# Patient Record
Sex: Female | Born: 1970 | Race: White | Hispanic: No | Marital: Married | Smoking: Never smoker
Health system: Southern US, Community
[De-identification: ages and names within clinical notes are randomized; demographics above are authoritative.]

## PROBLEM LIST (undated history)

## (undated) DIAGNOSIS — E559 Vitamin D deficiency, unspecified: Secondary | ICD-10-CM

## (undated) DIAGNOSIS — R519 Headache, unspecified: Secondary | ICD-10-CM

## (undated) DIAGNOSIS — R1011 Right upper quadrant pain: Secondary | ICD-10-CM

## (undated) DIAGNOSIS — Z9889 Other specified postprocedural states: Secondary | ICD-10-CM

## (undated) DIAGNOSIS — R112 Nausea with vomiting, unspecified: Secondary | ICD-10-CM

## (undated) DIAGNOSIS — R51 Headache: Secondary | ICD-10-CM

## (undated) HISTORY — DX: Vitamin D deficiency, unspecified: E55.9

## (undated) HISTORY — DX: Headache, unspecified: R51.9

## (undated) HISTORY — PX: WISDOM TOOTH EXTRACTION: SHX21

## (undated) HISTORY — DX: Headache: R51

## (undated) HISTORY — DX: Other specified postprocedural states: R11.2

## (undated) HISTORY — DX: Other specified postprocedural states: Z98.890

---

## 1998-11-01 ENCOUNTER — Inpatient Hospital Stay (HOSPITAL_COMMUNITY): Admission: AD | Admit: 1998-11-01 | Discharge: 1998-11-01 | Payer: Self-pay | Admitting: Obstetrics and Gynecology

## 1998-11-01 ENCOUNTER — Inpatient Hospital Stay (HOSPITAL_COMMUNITY): Admission: AD | Admit: 1998-11-01 | Discharge: 1998-11-03 | Payer: Self-pay | Admitting: Obstetrics and Gynecology

## 1998-11-07 ENCOUNTER — Encounter (HOSPITAL_COMMUNITY): Admission: RE | Admit: 1998-11-07 | Discharge: 1999-02-05 | Payer: Self-pay | Admitting: Obstetrics and Gynecology

## 2001-02-12 ENCOUNTER — Other Ambulatory Visit: Admission: RE | Admit: 2001-02-12 | Discharge: 2001-02-12 | Payer: Self-pay | Admitting: Gynecology

## 2001-12-21 ENCOUNTER — Inpatient Hospital Stay (HOSPITAL_COMMUNITY): Admission: AD | Admit: 2001-12-21 | Discharge: 2001-12-23 | Payer: Self-pay | Admitting: Gynecology

## 2002-01-28 ENCOUNTER — Other Ambulatory Visit: Admission: RE | Admit: 2002-01-28 | Discharge: 2002-01-28 | Payer: Self-pay | Admitting: Gynecology

## 2003-03-28 ENCOUNTER — Other Ambulatory Visit: Admission: RE | Admit: 2003-03-28 | Discharge: 2003-03-28 | Payer: Self-pay | Admitting: Gynecology

## 2003-09-21 ENCOUNTER — Other Ambulatory Visit: Admission: RE | Admit: 2003-09-21 | Discharge: 2003-09-21 | Payer: Self-pay | Admitting: Gynecology

## 2004-04-02 ENCOUNTER — Other Ambulatory Visit: Admission: RE | Admit: 2004-04-02 | Discharge: 2004-04-02 | Payer: Self-pay | Admitting: Gynecology

## 2005-04-09 ENCOUNTER — Other Ambulatory Visit: Admission: RE | Admit: 2005-04-09 | Discharge: 2005-04-09 | Payer: Self-pay | Admitting: Gynecology

## 2005-10-17 ENCOUNTER — Other Ambulatory Visit: Admission: RE | Admit: 2005-10-17 | Discharge: 2005-10-17 | Payer: Self-pay | Admitting: Gynecology

## 2006-06-19 ENCOUNTER — Other Ambulatory Visit: Admission: RE | Admit: 2006-06-19 | Discharge: 2006-06-19 | Payer: Self-pay | Admitting: Gynecology

## 2007-06-28 ENCOUNTER — Other Ambulatory Visit: Admission: RE | Admit: 2007-06-28 | Discharge: 2007-06-28 | Payer: Self-pay | Admitting: Gynecology

## 2008-03-12 ENCOUNTER — Inpatient Hospital Stay (HOSPITAL_COMMUNITY): Admission: AD | Admit: 2008-03-12 | Discharge: 2008-03-13 | Payer: Self-pay | Admitting: Obstetrics and Gynecology

## 2010-09-17 NOTE — Discharge Summary (Signed)
NAMEQUYNH, BASSO               ACCOUNT NO.:  [id number]   MEDICAL RECORD NO.:  [id number]          PATIENT TYPE:  INP   LOCATION:  9116                          FACILITY:  [facility]   PHYSICIAN:  Gerrit Friends. Aldona Bar, M.D.   DATE OF BIRTH:  05-Aug-1970   DATE OF ADMISSION:  03/12/2008  DATE OF DISCHARGE:  03/13/2008                               DISCHARGE SUMMARY   DISCHARGE DIAGNOSES:  1. Term pregnancy, delivered 6 pounds 12 ounces female infant, Apgars      9 and 9.  2. Blood type A positive.   PROCEDURES:  1. Normal spontaneous delivery.  2. Second-degree tear and repair.   SUMMARY:  This is a 40 year old gravida 3 now para 3 presented with term  and active labor and rapidly progressed and had a normal spontaneous  delivery of a viable 6-pound 12-ounce female infant with good Apgars  over second-degree tear, which was repaired without difficulty.  Postpartum course was benign.  Discharge hemoglobin 10.9 with a white  count of 1900 and platelet count of 141,000.  The patient delivered on  the afternoon of March 12, 2008, and was desirous of discharge on  March 13, 2008, and he accordingly was discharged to home as the baby  was discharged after 24 hours of observation as well.   At the time of discharge, mother was given a discharge brochure to  follow instructions.  She will finish up her vitamins.  She will use  Advil to every 4 or every 6 hours as needed for cramping or pain.  She  will return to the office for followup in approximately 4 weeks' time or  as needed.  She understood all instructions well at the time of  discharge.   CONDITION ON DISCHARGE:  Improved.      Gerrit Friends. Aldona Bar, M.D.  Electronically Signed     RMW/MEDQ  D:  03/13/2008  T:  03/13/2008  Job:  [id number]

## 2010-09-20 NOTE — Discharge Summary (Signed)
   NAME:  Abigail Choi, Abigail Choi                         ACCOUNT NO.:  [id number]   MEDICAL RECORD NO.:  [id number]                   PATIENT TYPE:  INP   LOCATION:  9115                                 [facility]ACILITY:  [facility]   PHYSICIAN:  Devin M. Ciliberti, M.D.            DATE OF BIRTH:  06/11/1970   DATE OF ADMISSION:  12/21/2001  DATE OF DISCHARGE:  12/23/2001                                 DISCHARGE SUMMARY   DISCHARGE DIAGNOSES:  1. Intrauterine pregnancy 39 weeks delivered.  2. Status post spontaneous vaginal delivery.   HISTORY:  A 41 year old female gravida 2, para 1 with an EDC of December 27, 2001.  Prenatal course was uncomplicated.   HOSPITAL COURSE:  On December 21, 2001 patient was admitted in labor.  Subsequently underwent a spontaneous vaginal delivery.  Delivered a female  Apgars of 9 and 9, weight 7 pounds 4 ounces.  There was a midline episiotomy  with second degree midline extension which was repaired.  There were  bilateral superficial labia minora lacerations.  No sutures were needed.  Postpartum patient remained afebrile, voiding, stable condition.  She was  discharged to home on December 23, 2001 in satisfactory condition.   ACCESSORY CLINICAL FINDINGS:  Laboratories:  The patient is A+, rubella  immune.  Hemoglobin on December 22, 2001 was 9.4.   DISPOSITION:  The patient discharged to home __________ six weeks.  Any  problem prior to that time to see in the office.     Susa Loffler, P.A.                    Devin M. Ciliberti, M.D.    TSG/MEDQ  D:  01/28/2002  T:  01/28/2002  Job:  [id number]

## 2010-09-26 ENCOUNTER — Other Ambulatory Visit (HOSPITAL_COMMUNITY): Payer: Self-pay | Admitting: Gastroenterology

## 2010-09-26 DIAGNOSIS — R1011 Right upper quadrant pain: Secondary | ICD-10-CM

## 2010-10-21 ENCOUNTER — Encounter (HOSPITAL_COMMUNITY): Payer: Self-pay

## 2010-10-21 ENCOUNTER — Encounter (HOSPITAL_COMMUNITY)
Admission: RE | Admit: 2010-10-21 | Discharge: 2010-10-21 | Disposition: A | Discharge status: Home / Self Care | Payer: Managed Care, Other (non HMO) | Source: Ambulatory Visit | Attending: Gastroenterology | Admitting: Gastroenterology

## 2010-10-21 DIAGNOSIS — R1011 Right upper quadrant pain: Secondary | ICD-10-CM

## 2010-10-21 HISTORY — DX: Right upper quadrant pain: R10.11

## 2010-10-21 MED ORDER — TECHNETIUM TC 99M MEBROFENIN IV KIT
5.4000 | PACK | Freq: Once | INTRAVENOUS | Status: AC | PRN
  Administered 2010-10-21: 5.4 via INTRAVENOUS

## 2010-10-21 MED ORDER — SINCALIDE 5 MCG IJ SOLR: 0.0200 ug/kg | Freq: Once | INTRAMUSCULAR | Status: DC

## 2011-02-04 LAB — CBC
HCT: 31.5 — ABNORMAL LOW
HCT: 37.2
Hemoglobin: 10.9 — ABNORMAL LOW
Hemoglobin: 12.9
MCHC: 34.5
MCHC: 34.6
MCV: 100.6 — ABNORMAL HIGH
MCV: 99.7
Platelets: 141 — ABNORMAL LOW
Platelets: 152
RBC: 3.13 — ABNORMAL LOW
RBC: 3.73 — ABNORMAL LOW
RDW: 13.3
RDW: 13.6
WBC: 10.8 — ABNORMAL HIGH
WBC: 9.8

## 2011-02-04 LAB — RPR: RPR Ser Ql: NONREACTIVE

## 2012-12-07 ENCOUNTER — Ambulatory Visit
Admission: RE | Admit: 2012-12-07 | Discharge: 2012-12-07 | Disposition: A | Discharge status: Home / Self Care | Payer: No Typology Code available for payment source | Source: Ambulatory Visit | Attending: Family Medicine | Admitting: Family Medicine

## 2012-12-07 ENCOUNTER — Other Ambulatory Visit: Payer: Self-pay | Admitting: Family Medicine

## 2012-12-07 DIAGNOSIS — R519 Headache, unspecified: Secondary | ICD-10-CM

## 2013-10-13 ENCOUNTER — Ambulatory Visit: Payer: Self-pay | Admitting: Family Medicine

## 2013-10-13 ENCOUNTER — Ambulatory Visit (INDEPENDENT_AMBULATORY_CARE_PROVIDER_SITE_OTHER): Payer: BC Managed Care – PPO | Admitting: Family Medicine

## 2013-10-13 ENCOUNTER — Encounter (INDEPENDENT_AMBULATORY_CARE_PROVIDER_SITE_OTHER): Payer: Self-pay

## 2013-10-13 ENCOUNTER — Encounter: Payer: Self-pay | Admitting: Family Medicine

## 2013-10-13 VITALS — BP 114/62 | HR 87 | Temp 98.2°F | Ht 67.0 in | Wt 132.5 lb

## 2013-10-13 DIAGNOSIS — R51 Headache: Secondary | ICD-10-CM

## 2013-10-13 DIAGNOSIS — G8929 Other chronic pain: Secondary | ICD-10-CM | POA: Insufficient documentation

## 2013-10-13 MED ORDER — MELOXICAM 15 MG PO TABS: ORAL_TABLET | ORAL | Status: DC

## 2013-10-13 NOTE — Assessment & Plan Note (Signed)
>  35 minutes spent in face to face time with patient, >50% spent in counselling or coordination of care. ?tension vs variant of TMJ/TGN Will try prn NSAID Headache journal Follow up in 1 month.

## 2013-10-13 NOTE — Progress Notes (Signed)
Subjective:   Patient ID: Abigail Choi, female    DOB: 11-18-1970, 43 y.o.   MRN: [id number]  Abigail Choi is a pleasant 43 y.o. year old female who presents to clinic today with Establish Care and Headache  on 10/13/2013  HPI: She has two concerns today:  1.  ?abnormal mammogram-  Sees Dr. Zenia Resides.  Mammogram and pap in 03/2013. Mammogram was neg but stated she had dense breasts.  Called OBGYN and told her there was no issues but she wanted to talk to me about this today. No FH of breast CA.  2.  Headaches- started last summer around 11/05/2012.  No prior h/o HA. Intermittent, started out daily and sharp.  Mostly right sided- no associated n/v or sensitivity to light.  Affects head, ear, face, sometimes burning. Previous PCP ordered head CT and placed her on muscle relaxants prn.  She felt it was tension HA. Pt was concerned because grandmother and uncle died of GBM.  Neg head CT 2012-12-10  Clinical Data: 43 year old female with right side headache. Left  extremity tingling. Some dizziness. Family history of brain  tumor. No known head injury.  CT HEAD WITHOUT CONTRAST  Technique: Contiguous axial images were obtained from the base of  the skull through the vertex without contrast.  Comparison: None.  Findings: Visualized paranasal sinuses and mastoids are clear. No  acute osseous abnormality identified. Visualized orbits and scalp  soft tissues are within normal limits.  Cerebral volume is normal. No midline shift, ventriculomegaly,  mass effect, evidence of mass lesion, intracranial hemorrhage or  evidence of cortically based acute infarction. Gray-white matter  differentiation is within normal limits throughout the brain. No  suspicious intracranial vascular hyperdensity.  IMPRESSION:  Normal noncontrast CT appearance of the brain.   Still gets them intermittent, now moves around- left and right.  No current outpatient prescriptions on file prior to visit.   No  current facility-administered medications on file prior to visit.    No Known Allergies  Past Medical History  Diagnosis Date  . RUQ abdominal pain   . Frequent headaches     History reviewed. No pertinent past surgical history.  Family History  Problem Relation Age of Onset  . Hypertension Father   . Heart disease Maternal Grandfather   . Cancer Paternal Grandmother   . Hypertension Paternal Grandfather   . Diabetes Paternal Grandfather     History   Social History  . Marital Status: Married    Spouse Name: N/A    Number of Children: N/A  . Years of Education: N/A   Occupational History  . Not on file.   Social History Main Topics  . Smoking status: Never Smoker   . Smokeless tobacco: Never Used  . Alcohol Use: No  . Drug Use: No  . Sexual Activity: Yes    Birth Control/ Protection: IUD   Other Topics Concern  . Not on file   Social History Narrative  . No narrative on file   The PMH, PSH, Social History, Family History, Medications, and allergies have been reviewed in [facility], and have been updated if relevant.      Review of Systems  See HPI    No blurred vision No nausea or vomiting No UE or LE weakness Objective:    BP 114/62  Pulse 87  Temp(Src) 98.2 F (36.8 C) (Oral)  Ht 5\' 7"  (1.702 m)  Wt 132 lb 8 oz (60.102 kg)  BMI 20.75 kg/m2  SpO2 98%   Physical Exam Gen: alert, pleasant, NAD Neuro: CN II-XII intact Normal gait Psych:  Good eye contact, not anxious or depressed appearing       Assessment & Plan:   Chronic headache disorder No Follow-up on file.

## 2013-10-13 NOTE — Patient Instructions (Addendum)
It was nice to meet you. Please keep a headache journal for me and follow up in 1 month.  Please consider a 3D mammogram in the future.  Doctor's orders- weekly massage.

## 2013-10-13 NOTE — Progress Notes (Signed)
Pre visit review using our clinic review tool, if applicable. No additional management support is needed unless otherwise documented below in the visit note. 

## 2013-11-02 ENCOUNTER — Encounter: Payer: Self-pay | Admitting: Family Medicine

## 2013-11-14 ENCOUNTER — Encounter: Payer: Self-pay | Admitting: Family Medicine

## 2013-11-14 ENCOUNTER — Ambulatory Visit (INDEPENDENT_AMBULATORY_CARE_PROVIDER_SITE_OTHER): Payer: BC Managed Care – PPO | Admitting: Family Medicine

## 2013-11-14 VITALS — BP 108/68 | HR 72 | Temp 97.8°F | Wt 133.2 lb

## 2013-11-14 DIAGNOSIS — G8929 Other chronic pain: Secondary | ICD-10-CM

## 2013-11-14 DIAGNOSIS — R519 Headache, unspecified: Secondary | ICD-10-CM

## 2013-11-14 DIAGNOSIS — R51 Headache: Secondary | ICD-10-CM

## 2013-11-14 NOTE — Patient Instructions (Signed)
Great to see you. Keep your journal.   Call me in a few weeks with an update- bring a copy of your journal to me if you can.

## 2013-11-14 NOTE — Assessment & Plan Note (Signed)
>  15 minutes spent in face to face time with patient, >50% spent in counselling or coordination of care. Very consistent with tension headaches. Advised to continue massage, journal. Drop off journal in 1 month and we can discuss over the phone. Call or return to clinic prn if these symptoms worsen or fail to improve as anticipated. The patient indicates understanding of these issues and agrees with the plan.

## 2013-11-14 NOTE — Progress Notes (Signed)
Pre visit review using our clinic review tool, if applicable. No additional management support is needed unless otherwise documented below in the visit note. 

## 2013-11-14 NOTE — Progress Notes (Signed)
Subjective:   Patient ID: Abigail Choi, female    DOB: [birth year], 43 y.o.   MRN: [id number]  Abigail Choi is a pleasant 43 y.o. year old female who presents to clinic today with Follow-up  on 11/14/2013  HPI: Here for 1 month follow up.  Established care with me last month:   Headaches- started last summer around 11/05/2012.  No prior h/o HA. Intermittent, started out daily and sharp.  Mostly right sided- no associated n/v or sensitivity to light.  Affects head, ear, face, sometimes burning. Previous PCP ordered head CT and placed her on muscle relaxants prn.  She felt it was tension HA. Pt was concerned because grandmother and uncle died of GBM.  Neg head CT 12-15-12  Clinical Data: 43 year old female with right side headache. Left  extremity tingling. Some dizziness. Family history of brain  tumor. No known head injury.  CT HEAD WITHOUT CONTRAST  Technique: Contiguous axial images were obtained from the base of  the skull through the vertex without contrast.  Comparison: None.  Findings: Visualized paranasal sinuses and mastoids are clear. No  acute osseous abnormality identified. Visualized orbits and scalp  soft tissues are within normal limits.  Cerebral volume is normal. No midline shift, ventriculomegaly,  mass effect, evidence of mass lesion, intracranial hemorrhage or  evidence of cortically based acute infarction. Gray-white matter  differentiation is within normal limits throughout the brain. No  suspicious intracranial vascular hyperdensity.  IMPRESSION:  Normal noncontrast CT appearance of the brain.   Still gets them intermittent, now moves around- left and right.  Advised her to keep HA journal. ?tension vs variant of TMJ. Given rx for meloxicam.  Brings her headache journal in--feels MUCH better.  Massage every other week has decreased her HA and intensity of her headaches tremendously.  Only took one dose of meloxicam.  Current Outpatient Prescriptions  on File Prior to Visit  Medication Sig Dispense Refill  . cyclobenzaprine (FLEXERIL) 10 MG tablet Take 10 mg by mouth at bedtime as needed for muscle spasms.      . meloxicam (MOBIC) 15 MG tablet 1 tab by mouth daily with food  30 tablet  0   No current facility-administered medications on file prior to visit.    No Known Allergies  Past Medical History  Diagnosis Date  . RUQ abdominal pain   . Frequent headaches     No past surgical history on file.  Family History  Problem Relation Age of Onset  . Hypertension Father   . Heart disease Maternal Grandfather   . Cancer Paternal Grandmother   . Hypertension Paternal Grandfather   . Diabetes Paternal Grandfather     History   Social History  . Marital Status: Married    Spouse Name: N/A    Number of Children: N/A  . Years of Education: N/A   Occupational History  . Not on file.   Social History Main Topics  . Smoking status: Never Smoker   . Smokeless tobacco: Never Used  . Alcohol Use: No  . Drug Use: No  . Sexual Activity: Yes    Birth Control/ Protection: IUD   Other Topics Concern  . Not on file   Social History Narrative  . No narrative on file   The PMH, PSH, Social History, Family History, Medications, and allergies have been reviewed in [facility], and have been updated if relevant.      Review of Systems  See HPI    No blurred  vision No nausea or vomiting No UE or LE weakness Objective:    BP 108/68  Pulse 72  Temp(Src) 97.8 F (36.6 C) (Oral)  Wt 133 lb 4 oz (60.442 kg)   Physical Exam Gen: alert, pleasant, NAD Normal gait Psych:  Good eye contact, not anxious or depressed appearing       Assessment & Plan:   Chronic nonintractable headache, unspecified headache type No Follow-up on file.

## 2014-03-27 ENCOUNTER — Ambulatory Visit (INDEPENDENT_AMBULATORY_CARE_PROVIDER_SITE_OTHER): Payer: BC Managed Care – PPO | Admitting: Family Medicine

## 2014-03-27 ENCOUNTER — Encounter: Payer: Self-pay | Admitting: Family Medicine

## 2014-03-27 ENCOUNTER — Other Ambulatory Visit (HOSPITAL_COMMUNITY)
Admission: RE | Admit: 2014-03-27 | Discharge: 2014-03-27 | Disposition: A | Discharge status: Home / Self Care | Payer: BC Managed Care – PPO | Source: Ambulatory Visit | Attending: Family Medicine | Admitting: Family Medicine

## 2014-03-27 VITALS — BP 102/62 | HR 72 | Temp 98.4°F | Ht 67.0 in | Wt 136.0 lb

## 2014-03-27 DIAGNOSIS — Z01419 Encounter for gynecological examination (general) (routine) without abnormal findings: Secondary | ICD-10-CM

## 2014-03-27 DIAGNOSIS — R51 Headache: Secondary | ICD-10-CM

## 2014-03-27 DIAGNOSIS — G8929 Other chronic pain: Secondary | ICD-10-CM

## 2014-03-27 DIAGNOSIS — Z1239 Encounter for other screening for malignant neoplasm of breast: Secondary | ICD-10-CM

## 2014-03-27 DIAGNOSIS — R519 Headache, unspecified: Secondary | ICD-10-CM

## 2014-03-27 DIAGNOSIS — Z1151 Encounter for screening for human papillomavirus (HPV): Secondary | ICD-10-CM | POA: Diagnosis present

## 2014-03-27 DIAGNOSIS — R202 Paresthesia of skin: Secondary | ICD-10-CM | POA: Insufficient documentation

## 2014-03-27 DIAGNOSIS — Z23 Encounter for immunization: Secondary | ICD-10-CM

## 2014-03-27 LAB — COMPREHENSIVE METABOLIC PANEL
ALT: 13 U/L (ref 0–35)
AST: 18 U/L (ref 0–37)
Albumin: 4.4 g/dL (ref 3.5–5.2)
Alkaline Phosphatase: 47 U/L (ref 39–117)
BUN: 14 mg/dL (ref 6–23)
CO2: 26 mEq/L (ref 19–32)
Calcium: 9.7 mg/dL (ref 8.4–10.5)
Chloride: 104 mEq/L (ref 96–112)
Creatinine, Ser: 0.8 mg/dL (ref 0.4–1.2)
GFR: 82.19 mL/min (ref >60.00)
Glucose, Bld: 90 mg/dL (ref 70–99)
Potassium: 4.3 mEq/L (ref 3.5–5.1)
Sodium: 138 mEq/L (ref 135–145)
Total Bilirubin: 0.7 mg/dL (ref 0.2–1.2)
Total Protein: 7.5 g/dL (ref 6.0–8.3)

## 2014-03-27 LAB — CBC WITH DIFFERENTIAL/PLATELET
Basophils Absolute: 0 10*3/uL (ref 0.0–0.1)
Basophils Relative: 0.6 % (ref 0.0–3.0)
Eosinophils Absolute: 0 10*3/uL (ref 0.0–0.7)
Eosinophils Relative: 1.2 % (ref 0.0–5.0)
HCT: 36.4 % (ref 36.0–46.0)
Hemoglobin: 12.2 g/dL (ref 12.0–15.0)
Lymphocytes Relative: 26.1 % (ref 12.0–46.0)
Lymphs Abs: 1 10*3/uL (ref 0.7–4.0)
MCHC: 33.6 g/dL (ref 30.0–36.0)
MCV: 95.9 fl (ref 78.0–100.0)
Monocytes Absolute: 0.3 10*3/uL (ref 0.1–1.0)
Monocytes Relative: 7.1 % (ref 3.0–12.0)
Neutro Abs: 2.6 10*3/uL (ref 1.4–7.7)
Neutrophils Relative %: 65 % (ref 43.0–77.0)
Platelets: 163 10*3/uL (ref 150.0–400.0)
RBC: 3.79 Mil/uL — ABNORMAL LOW (ref 3.87–5.11)
RDW: 12.3 % (ref 11.5–15.5)
WBC: 4 10*3/uL (ref 4.0–10.5)

## 2014-03-27 LAB — VITAMIN B12: Vitamin B-12: 531 pg/mL (ref 211–911)

## 2014-03-27 LAB — LIPID PANEL
Cholesterol: 174 mg/dL (ref 0–200)
HDL: 57.4 mg/dL (ref >39.00)
LDL Cholesterol: 107 mg/dL — ABNORMAL HIGH (ref 0–99)
NonHDL: 116.6
Total CHOL/HDL Ratio: 3
Triglycerides: 46 mg/dL (ref 0.0–149.0)
VLDL: 9.2 mg/dL (ref 0.0–40.0)

## 2014-03-27 LAB — TSH: TSH: 1.83 u[IU]/mL (ref 0.35–4.50)

## 2014-03-27 NOTE — Progress Notes (Signed)
Pre visit review using our clinic review tool, if applicable. No additional management support is needed unless otherwise documented below in the visit note. 

## 2014-03-27 NOTE — Progress Notes (Signed)
Subjective:   Patient ID: Abigail Choi, female    DOB: 02/24/1971, 43 y.o.   MRN: [id number]  Abigail Choi is a pleasant 43 y.o. year old female who presents to clinic today with Annual Exam  on 03/27/2014  HPI:  Has had IUD for 4 years.  Still has "random" spotting but periods much lighter than they were prior to getting IUD. Due for mammogram.  Chronic headaches- started in 11/2012.  Remain right sided and intermittent.  Head CT in 12/2012 neg.  She is concerned because now she has tingling in her feet and arms (moves sides) consistently.  Neg head CT 12/07/12  Clinical Data: 43 year old female with right side headache. Left  extremity tingling. Some dizziness. Family history of brain  tumor. No known head injury.  CT HEAD WITHOUT CONTRAST  Technique: Contiguous axial images were obtained from the base of  the skull through the vertex without contrast.  Comparison: None.  Findings: Visualized paranasal sinuses and mastoids are clear. No  acute osseous abnormality identified. Visualized orbits and scalp  soft tissues are within normal limits.  Cerebral volume is normal. No midline shift, ventriculomegaly,  mass effect, evidence of mass lesion, intracranial hemorrhage or  evidence of cortically based acute infarction. Gray-white matter  differentiation is within normal limits throughout the brain. No  suspicious intracranial vascular hyperdensity.  IMPRESSION:  Normal noncontrast CT appearance of the brain.   Still gets them intermittent, now moves around- left and right.  Initially massage was helping with headaches but she does not feel it is as effective.  Current Outpatient Prescriptions on File Prior to Visit  Medication Sig Dispense Refill  . cyclobenzaprine (FLEXERIL) 10 MG tablet Take 10 mg by mouth at bedtime as needed for muscle spasms.    . meloxicam (MOBIC) 15 MG tablet 1 tab by mouth daily with food 30 tablet 0   No current facility-administered medications  on file prior to visit.    No Known Allergies  Past Medical History  Diagnosis Date  . RUQ abdominal pain   . Frequent headaches     No past surgical history on file.  Family History  Problem Relation Age of Onset  . Hypertension Father   . Heart disease Maternal Grandfather   . Cancer Paternal Grandmother   . Hypertension Paternal Grandfather   . Diabetes Paternal Grandfather     History   Social History  . Marital Status: Married    Spouse Name: N/A    Number of Children: N/A  . Years of Education: N/A   Occupational History  . Not on file.   Social History Main Topics  . Smoking status: Never Smoker   . Smokeless tobacco: Never Used  . Alcohol Use: No  . Drug Use: No  . Sexual Activity: Yes    Birth Control/ Protection: IUD   Other Topics Concern  . Not on file   Social History Narrative   The PMH, PSH, Social History, Family History, Medications, and allergies [facility], and have been updated if relevant.      Review of Systems  Constitutional: Negative.   Eyes: Negative.   Respiratory: Negative.   Cardiovascular: Negative.   Gastrointestinal: Negative.   Endocrine: Negative.   Genitourinary: Negative.   Musculoskeletal: Negative.   Allergic/Immunologic: Negative.   Neurological: Positive for numbness and headaches. Negative for dizziness, tremors, seizures, syncope, facial asymmetry, speech difficulty, weakness and light-headedness.  Hematological: Negative.     See HPI  Objective:    BP 102/62 mmHg  Pulse 72  Temp(Src) 98.4 F (36.9 C) (Oral)  Ht 5\' 7"  (1.702 m)  Wt 136 lb (61.689 kg)  BMI 21.30 kg/m2  SpO2 97%  LMP 03/25/2014   Physical Exam  General:  Well-developed,well-nourished,in no acute distress; alert,appropriate and cooperative throughout examination Head:  normocephalic and atraumatic.   Eyes:  vision grossly intact, pupils equal, pupils round, and pupils reactive to light.   Ears:  R ear normal  and L ear normal.   Nose:  no external deformity.   Mouth:  good dentition.   Neck:  No deformities, masses, or tenderness noted. Breasts:  No mass, nodules, thickening, tenderness, bulging, retraction, inflamation, nipple discharge or skin changes noted.   Lungs:  Normal respiratory effort, chest expands symmetrically. Lungs are clear to auscultation, no crackles or wheezes. Heart:  Normal rate and regular rhythm. S1 and S2 normal without gallop, murmur, click, rub or other extra sounds. Abdomen:  Bowel sounds positive,abdomen soft and non-tender without masses, organomegaly or hernias noted. Rectal:  no external abnormalities.   Genitalia:  Pelvic Exam:        External: normal female genitalia without lesions or masses        Vagina: normal without lesions or masses        Cervix: normal without lesions or masses        Adnexa: normal bimanual exam without masses or fullness        Uterus: normal by palpation        Pap smear: performed Msk:  No deformity or scoliosis noted of thoracic or lumbar spine.   Extremities:  No clubbing, cyanosis, edema, or deformity noted with normal full range of motion of all joints.   Neurologic:  alert & oriented X3 and gait normal.   Skin:  Intact without suspicious lesions or rashes Cervical Nodes:  No lymphadenopathy noted Axillary Nodes:  No palpable lymphadenopathy Psych:  Cognition and judgment appear intact. Alert and cooperative with normal attention span and concentration. No apparent delusions, illusions, hallucinations       Assessment & Plan:   Well woman exam with routine gynecological exam - Plan: CBC with Differential, Comprehensive metabolic panel, Lipid panel, TSH  Screening for breast cancer - Plan: MM Digital Screening  Chronic nonintractable headache, unspecified headache type - Plan: MR Brain W Wo Contrast  Paresthesia - Plan: Vitamin B12, MR Brain W Wo Contrast No Follow-up on file.

## 2014-03-27 NOTE — Assessment & Plan Note (Signed)
Persistent and now with paresthesias. Discussed with pt.  She is very anxious about this since she does have a family h/o GBM.  Will order MRI of brain to rule out other pathology like MS, etc. The patient indicates understanding of these issues and agrees with the plan.

## 2014-03-27 NOTE — Addendum Note (Signed)
Addended by: Desmond DikeKNIGHT, Aymar Whitfill H on: 03/27/2014 10:42 AM   Modules accepted: Orders

## 2014-03-27 NOTE — Assessment & Plan Note (Signed)
New- see above. 

## 2014-03-27 NOTE — Assessment & Plan Note (Addendum)
Reviewed preventive care protocols, scheduled due services, and updated immunizations Discussed nutrition, exercise, diet, and healthy lifestyle.  Pap smear  Mammogram ordered. Flu vaccine given today.  Orders Placed This Encounter  Procedures  . MM Digital Screening  . MR Brain W Wo Contrast  . CBC with Differential  . Comprehensive metabolic panel  . Lipid panel  . TSH  . Vitamin B12

## 2014-03-27 NOTE — Patient Instructions (Signed)
Great to see you. Have a Happy Holiday.  We will call you with your lab results.  Please schedule your mammogram.  We will call you with your MRI or you can stop by to see Shirlee LimerickMarion on your way out.

## 2014-03-28 ENCOUNTER — Encounter: Payer: Self-pay | Admitting: *Deleted

## 2014-03-28 LAB — CYTOLOGY - PAP

## 2014-03-29 ENCOUNTER — Encounter: Payer: Self-pay | Admitting: *Deleted

## 2014-04-04 ENCOUNTER — Ambulatory Visit
Admission: RE | Admit: 2014-04-04 | Discharge: 2014-04-04 | Disposition: A | Discharge status: Home / Self Care | Payer: BC Managed Care – PPO | Source: Ambulatory Visit | Attending: Family Medicine | Admitting: Family Medicine

## 2014-04-04 DIAGNOSIS — R519 Headache, unspecified: Secondary | ICD-10-CM

## 2014-04-04 DIAGNOSIS — G8929 Other chronic pain: Secondary | ICD-10-CM

## 2014-04-04 DIAGNOSIS — R202 Paresthesia of skin: Secondary | ICD-10-CM

## 2014-04-04 DIAGNOSIS — R51 Headache: Principal | ICD-10-CM

## 2014-04-04 MED ORDER — GADOBENATE DIMEGLUMINE 529 MG/ML IV SOLN
12.0000 mL | Freq: Once | INTRAVENOUS | Status: AC | PRN
  Administered 2014-04-04: 12 mL via INTRAVENOUS

## 2014-04-06 ENCOUNTER — Other Ambulatory Visit: Payer: BC Managed Care – PPO

## 2014-04-18 ENCOUNTER — Ambulatory Visit
Admission: RE | Admit: 2014-04-18 | Discharge: 2014-04-18 | Disposition: A | Discharge status: Home / Self Care | Payer: BC Managed Care – PPO | Source: Ambulatory Visit | Attending: Family Medicine | Admitting: Family Medicine

## 2014-04-18 DIAGNOSIS — Z1239 Encounter for other screening for malignant neoplasm of breast: Secondary | ICD-10-CM

## 2014-11-08 ENCOUNTER — Encounter: Payer: Self-pay | Admitting: Family Medicine

## 2014-11-09 ENCOUNTER — Telehealth: Payer: Self-pay | Admitting: Family Medicine

## 2014-11-09 NOTE — Telephone Encounter (Signed)
I cannot make that call without seeing her.  Please call to check on pt.

## 2014-11-09 NOTE — Telephone Encounter (Signed)
Spoke to pt who states that she believes that it may be GERD. She is not having dizziness, HA or ShOB. Thinks that she will be ok until appt. Advised pt to contact office back or go to ED if s/s change or increase

## 2014-11-09 NOTE — Telephone Encounter (Signed)
Pt made her own appointment through my chart  Is it ok to wait till 11/16/14  See below reason  Appointment For: Abigail GublerRHAM,Abigail Choi ([id number])    Visit Type: MYCHART OFFICE VISIT (1064)      11/16/2014  8:15 AM 15 mins. Dianne Dunalia M Aron, MD     [facility]      Patient Comments:   Office Visit   Chest pain

## 2014-11-16 ENCOUNTER — Encounter: Payer: Self-pay | Admitting: Family Medicine

## 2014-11-16 ENCOUNTER — Ambulatory Visit (INDEPENDENT_AMBULATORY_CARE_PROVIDER_SITE_OTHER): Payer: BLUE CROSS/BLUE SHIELD | Admitting: Family Medicine

## 2014-11-16 VITALS — BP 108/68 | HR 76 | Temp 98.1°F | Wt 132.2 lb

## 2014-11-16 DIAGNOSIS — R079 Chest pain, unspecified: Secondary | ICD-10-CM | POA: Diagnosis not present

## 2014-11-16 LAB — H. PYLORI ANTIBODY, IGG: H Pylori IgG: NEGATIVE

## 2014-11-16 NOTE — Assessment & Plan Note (Signed)
New- intermittent. Unlikely cardiac in nature.  EKG reassuring- NSR and symptoms reassuring as well. Advised taking prn pepcid or PPI like nexium and to keep me updated with symptoms. Check H Pylori today to rule this out as well. The patient indicates understanding of these issues and agrees with the plan.

## 2014-11-16 NOTE — Progress Notes (Signed)
Pre visit review using our clinic review tool, if applicable. No additional management support is needed unless otherwise documented below in the visit note. 

## 2014-11-16 NOTE — Patient Instructions (Signed)
Good to see you. Your EKG looks great which is very reassuring. Please take as needed pepcid, keep me updated. I will call you with your lab results from today.

## 2014-11-16 NOTE — Progress Notes (Signed)
Subjective:   Patient ID: Abigail Choi, female    DOB: 12/16/1970, 44 y.o.   MRN: [id number]  Abigail Gublerngela S Tison is a pleasant 44 y.o. year old female who presents to clinic today with Gastrophageal Reflux and Chest Pain  on 11/16/2014  HPI:  Chest pain- since mid May (at least 2 months), intermittent epigastric, substernal pain.  Does not typically occur with exertion.  Occurs "randomly" and at rest.  Has tried pepcid.  Not sure if it made it better.  Symptoms are fleeting.  Not associated with nausea or vomiting.  No diaphoresis.  She did notice her left arm was a little sore once which made her nervous.  Her grandmother had an MI in her 1150s.  No shortness of breath. Lab Results  Component Value Date   CHOL 174 03/27/2014   HDL 57.40 03/27/2014   LDLCALC 107* 03/27/2014   TRIG 46.0 03/27/2014   CHOLHDL 3 03/27/2014   Current Outpatient Prescriptions on File Prior to Visit  Medication Sig Dispense Refill  . cyclobenzaprine (FLEXERIL) 10 MG tablet Take 10 mg by mouth at bedtime as needed for muscle spasms.    . meloxicam (MOBIC) 15 MG tablet 1 tab by mouth daily with food 30 tablet 0   No current facility-administered medications on file prior to visit.    No Known Allergies  Past Medical History  Diagnosis Date  . RUQ abdominal pain   . Frequent headaches     No past surgical history on file.  Family History  Problem Relation Age of Onset  . Hypertension Father   . Heart disease Maternal Grandfather   . Cancer Paternal Grandmother   . Hypertension Paternal Grandfather   . Diabetes Paternal Grandfather     History   Social History  . Marital Status: Married    Spouse Name: N/A  . Number of Children: N/A  . Years of Education: N/A   Occupational History  . Not on file.   Social History Main Topics  . Smoking status: Never Smoker   . Smokeless tobacco: Never Used  . Alcohol Use: No  . Drug Use: No  . Sexual Activity: Yes    Birth Control/ Protection: IUD     Other Topics Concern  . Not on file   Social History Narrative   The PMH, PSH, Social History, Family History, Medications, and allergies have been reviewed in [facility], and have been updated if relevant.   Review of Systems  Constitutional: Negative.   HENT: Negative.   Eyes: Negative.   Respiratory: Negative.   Cardiovascular: Positive for chest pain. Negative for palpitations and leg swelling.  Endocrine: Negative.   Genitourinary: Negative.   Musculoskeletal: Positive for arthralgias.  Skin: Negative.   Allergic/Immunologic: Negative.   Neurological: Negative.   Hematological: Negative.   Psychiatric/Behavioral: Negative.   All other systems reviewed and are negative.      Objective:    BP 108/68 mmHg  Pulse 76  Temp(Src) 98.1 F (36.7 C) (Oral)  Wt 132 lb 4 oz (59.988 kg)  SpO2 97%   Physical Exam  Constitutional: She is oriented to person, place, and time. She appears well-developed and well-nourished. No distress.  HENT:  Head: Normocephalic and atraumatic.  Eyes: Conjunctivae are normal.  Cardiovascular: Normal rate, regular rhythm and normal heart sounds.   Pulmonary/Chest: Effort normal and breath sounds normal. No respiratory distress. She has no wheezes.  Abdominal: Soft.  Musculoskeletal: Normal range of motion. She exhibits no edema.  Neurological: She is alert and oriented to person, place, and time. No cranial nerve deficit.  Skin: Skin is warm and dry.  Psychiatric: She has a normal mood and affect. Her behavior is normal. Judgment and thought content normal.  Nursing note and vitals reviewed.         Assessment & Plan:   Chest pain, unspecified chest pain type - Plan: EKG 12-Lead No Follow-up on file.

## 2014-11-17 ENCOUNTER — Encounter: Payer: Self-pay | Admitting: Family Medicine

## 2015-03-21 ENCOUNTER — Encounter: Payer: Self-pay | Admitting: Family Medicine

## 2015-03-28 ENCOUNTER — Other Ambulatory Visit: Payer: Self-pay

## 2015-03-28 DIAGNOSIS — Z1231 Encounter for screening mammogram for malignant neoplasm of breast: Secondary | ICD-10-CM

## 2015-04-09 ENCOUNTER — Encounter: Payer: Self-pay | Admitting: Family Medicine

## 2015-04-10 ENCOUNTER — Encounter: Payer: Self-pay | Admitting: Emergency Medicine

## 2015-04-10 ENCOUNTER — Emergency Department: Payer: BLUE CROSS/BLUE SHIELD

## 2015-04-10 ENCOUNTER — Emergency Department
Admission: EM | Admit: 2015-04-10 | Discharge: 2015-04-10 | Disposition: A | Discharge status: Home / Self Care | Payer: BLUE CROSS/BLUE SHIELD | Attending: Emergency Medicine | Admitting: Emergency Medicine

## 2015-04-10 DIAGNOSIS — R450 Nervousness: Secondary | ICD-10-CM | POA: Insufficient documentation

## 2015-04-10 DIAGNOSIS — F419 Anxiety disorder, unspecified: Secondary | ICD-10-CM | POA: Insufficient documentation

## 2015-04-10 DIAGNOSIS — Z791 Long term (current) use of non-steroidal anti-inflammatories (NSAID): Secondary | ICD-10-CM | POA: Insufficient documentation

## 2015-04-10 DIAGNOSIS — R0789 Other chest pain: Secondary | ICD-10-CM | POA: Diagnosis present

## 2015-04-10 DIAGNOSIS — Z79899 Other long term (current) drug therapy: Secondary | ICD-10-CM | POA: Insufficient documentation

## 2015-04-10 DIAGNOSIS — R079 Chest pain, unspecified: Secondary | ICD-10-CM

## 2015-04-10 DIAGNOSIS — R202 Paresthesia of skin: Secondary | ICD-10-CM | POA: Insufficient documentation

## 2015-04-10 LAB — BASIC METABOLIC PANEL
Anion gap: 7 (ref 5–15)
BUN: 14 mg/dL (ref 6–20)
CO2: 26 mmol/L (ref 22–32)
Calcium: 9.6 mg/dL (ref 8.9–10.3)
Chloride: 106 mmol/L (ref 101–111)
Creatinine, Ser: 0.81 mg/dL (ref 0.44–1.00)
GFR calc Af Amer: >60 mL/min (ref >60)
GFR calc non Af Amer: >60 mL/min (ref >60)
Glucose, Bld: 119 mg/dL — ABNORMAL HIGH (ref 65–99)
Potassium: 3.8 mmol/L (ref 3.5–5.1)
Sodium: 139 mmol/L (ref 135–145)

## 2015-04-10 LAB — CBC
HCT: 37.2 % (ref 35.0–47.0)
Hemoglobin: 12.6 g/dL (ref 12.0–16.0)
MCH: 31.9 pg (ref 26.0–34.0)
MCHC: 33.8 g/dL (ref 32.0–36.0)
MCV: 94.3 fL (ref 80.0–100.0)
Platelets: 177 10*3/uL (ref 150–440)
RBC: 3.94 MIL/uL (ref 3.80–5.20)
RDW: 12.3 % (ref 11.5–14.5)
WBC: 6.4 10*3/uL (ref 3.6–11.0)

## 2015-04-10 LAB — TROPONIN I
Troponin I: <0.03 ng/mL (ref <0.031)
Troponin I: <0.03 ng/mL (ref <0.031)

## 2015-04-10 NOTE — Discharge Instructions (Signed)
You were evaluated for sensation of hot flash and chest discomfort, and although no certain cause was found, your exam and evaluation are reassuring. We discussed I suspect he may have had an episode of transient low blood pressure from mild dehydration especially given the caffeine intake this morning. Make sure you drinking plain fluids.  Return to the emergency department for any worsening condition including chest pain, trouble breathing, sweats, dizziness or passing out, weakness, numbness, or any other symptoms concerning to.  We discussed although you do not have any significant high risk factors for coronary artery disease, I am recommending that you follow-up with a cardiologist, and call Dr. Ria Clockalderwood's office in the morning for an appointment within the next 2 days.   Nonspecific Chest Pain It is often hard to find the cause of chest pain. There is always a chance that your pain could be related to something serious, such as a heart attack or a blood clot in your lungs. Chest pain can also be caused by conditions that are not life-threatening. If you have chest pain, it is very important to follow up with your doctor.  HOME CARE  If you were prescribed an antibiotic medicine, finish it all even if you start to feel better.  Avoid any activities that cause chest pain.  Do not use any tobacco products, including cigarettes, chewing tobacco, or electronic cigarettes. If you need help quitting, ask your doctor.  Do not drink alcohol.  Take medicines only as told by your doctor.  Keep all follow-up visits as told by your doctor. This is important. This includes any further testing if your chest pain does not go away.  Your doctor may tell you to keep your head raised (elevated) while you sleep.  Make lifestyle changes as told by your doctor. These may include:  Getting regular exercise. Ask your doctor to suggest some activities that are safe for you.  Eating a heart-healthy  diet. Your doctor or a diet specialist (dietitian) can help you to learn healthy eating options.  Maintaining a healthy weight.  Managing diabetes, if necessary.  Reducing stress. GET HELP IF:  Your chest pain does not go away, even after treatment.  You have a rash with blisters on your chest.  You have a fever. GET HELP RIGHT AWAY IF:  Your chest pain is worse.  You have an increasing cough, or you cough up blood.  You have severe belly (abdominal) pain.  You feel extremely weak.  You pass out (faint).  You have chills.  You have sudden, unexplained chest discomfort.  You have sudden, unexplained discomfort in your arms, back, neck, or jaw.  You have shortness of breath at any time.  You suddenly start to sweat, or your skin gets clammy.  You feel nauseous.  You vomit.  You suddenly feel light-headed or dizzy.  Your heart begins to beat quickly, or it feels like it is skipping beats. These symptoms may be an emergency. Do not wait to see if the symptoms will go away. Get medical help right away. Call your local emergency services (911 in the [place] Do not drive yourself to the hospital.   This information is not intended to replace advice given to you by your health care provider. Make sure you discuss any questions you have with your health care provider.   Document Released: 10/08/2007 Document Revised: 05/12/2014 Document Reviewed: 11/25/2013 Elsevier Interactive Patient Education [employer]2016 Elsevier Inc.

## 2015-04-10 NOTE — ED Provider Notes (Signed)
[facility] Emergency Department Provider Note   ____________________________________________  Time seen: I have reviewed the triage vital signs and the triage nursing note.  HISTORY  Chief Complaint Chest Pain   Historian Patient  HPI Abigail Choi is a 44 y.o. female who is here for evaluation of chest pressure and heart racing. Patient states that she has had several episodes over the past month or so where she has had some mild left-sided chest discomfort. She did see her PCP about a month ago and had an EKG and was told it was normal. She's never had exertional chest discomfort. 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 After she completed the class, and went back to her office she felt a sensation of flush/warmth from her head down to her body and then started to feel slightly anxious with heart racing and left-sided chest pressure with some radiation to the left arm. She states that she's had some tingling to her left arm at times in the past, which has not been associated with chest discomfort. She is questioning whether she might have had symptoms of carpal tunnel at some point in time.  She is mostly worried about whether or not these symptoms could be due to the heart.    Past Medical History  Diagnosis Date  . RUQ abdominal pain   . Frequent headaches     Patient Active Problem List   Diagnosis Date Noted  . Chest pain 11/16/2014  . Paresthesia 03/27/2014  . Chronic headache disorder 10/13/2013    History reviewed. No pertinent past surgical history.  Current Outpatient Rx  Name  Route  Sig  Dispense  Refill  . cyclobenzaprine (FLEXERIL) 10 MG tablet   Oral   Take 10 mg by mouth at bedtime as needed for muscle spasms.         . meloxicam (MOBIC) 15 MG tablet      1 tab by mouth daily with food   30 tablet   0     Allergies Review of patient's allergies indicates no  known allergies.  Family History  Problem Relation Age of Onset  . Hypertension Father   . Heart disease Maternal Grandfather   . Cancer Paternal Grandmother   . Hypertension Paternal Grandfather   . Diabetes Paternal Grandfather     Social History Social History  Substance Use Topics  . Smoking status: Never Smoker   . Smokeless tobacco: Never Used  . Alcohol Use: No     Review of Systems  Constitutional: Negative for fever. Eyes: Negative for visual changes. ENT: Negative for sore throat. Cardiovascular: Negative for irregular heartbeat Respiratory: Negative for shortness of breath, negative for coughing. Gastrointestinal: Negative for abdominal pain, vomiting and diarrhea. Genitourinary: Negative for dysuria. Musculoskeletal: Negative for back pain. Skin: Negative for rash. Neurological: Negative for headache. 10 point Review of Systems otherwise negative ____________________________________________   PHYSICAL EXAM:  VITAL SIGNS: ED Triage Vitals  Enc Vitals Group     BP 04/10/15 1236 136/75 mmHg     Pulse Rate 04/10/15 1236 125     Resp 04/10/15 1236 20     Temp 04/10/15 1236 97.4 F (36.3 C)     Temp Source 04/10/15 1236 Oral     SpO2 04/10/15 1236 100 %     Weight 04/10/15 1236 133 lb (60.328 kg)     Height 04/10/15 1236 5' 7.25" (1.708 m)  Head Cir --      Peak Flow --      Pain Score 04/10/15 1233 2     Pain Loc --      Pain Edu? --      Excl. in GC? --      Constitutional: Alert and oriented. Well appearing and in no distress. Slightly anxious. Eyes: Conjunctivae are normal. PERRL. Normal extraocular movements. ENT   Head: Normocephalic and atraumatic.   Nose: No congestion/rhinnorhea.   Mouth/Throat: Mucous membranes are moist.   Neck: No stridor. Cardiovascular/Chest: Normal rate, regular rhythm.  No murmurs, rubs, or gallops. Respiratory: Normal respiratory effort without tachypnea nor retractions. Breath sounds are clear  and equal bilaterally. No wheezes/rales/rhonchi. Gastrointestinal: Soft. No distention, no guarding, no rebound. Nontender   Genitourinary/rectal:Deferred Musculoskeletal: Nontender with normal range of motion in all extremities. No joint effusions.  No lower extremity tenderness.  No edema. Neurologic:  Normal speech and language. No gross or focal neurologic deficits are appreciated. Skin:  Skin is warm, dry and intact. No rash noted. Psychiatric: Mood and affect are normal. Speech and behavior are normal. Patient exhibits appropriate insight and judgment.  ____________________________________________   EKG I, Governor Rooks, MD, the attending physician have personally viewed and interpreted all ECGs.  114 bpm. Sinus tachycardia. Incomplete right bundle-branch block. Undetermined axis. Nonspecific ST/T wave.  Repeat EKG. 75 bpm. Normal sinus rhythm. Incomplete right bundle-branch block. Normal axis. Normal ST and T-wave ____________________________________________  LABS (pertinent positives/negatives)  Basic metabolic panel, CBC are without significant abnormality Troponin less than 0.03 Repeat troponin: Less than 0.03  ____________________________________________  RADIOLOGY All Xrays were viewed by me. Imaging interpreted by Radiologist.  Chest a 2 view:  IMPRESSION: 1. Biapical pleural parenchymal scarring, likely due to remote granulomatous disease. Otherwise, no significant abnormalities are observed. __________________________________________  PROCEDURES  Procedure(s) performed: None  Critical Care performed: None  ____________________________________________   ED COURSE / ASSESSMENT AND PLAN  CONSULTATIONS: None  Pertinent labs & imaging results that were available during my care of the patient were reviewed by me and considered in my medical decision making (see chart for details).   Patient is overall well-appearing with a normal exam. She is slightly  anxious, and I am most suspicious that the episode today was a combination of excess sympathomimetic due to caffeine intake plus adrenaline from stress of job this morning, and possible mild dehydration.  Her initial heart rate was elevated, and on exam now it is normal. Her EKG is reassuring. She does not have any risk factors for coronary artery disease, however given that she's had multiple episodes of chest discomfort, I am going to refer her to follow-up with a cardiologist and consider echocardiogram/stress testing as recommended by the cardiologist.    Patient / Family / Caregiver informed of clinical course, medical decision-making process, and agree with plan.   I discussed return precautions, follow-up instructions, and discharged instructions with patient and/or family.  ___________________________________________   FINAL CLINICAL IMPRESSION(S) / ED DIAGNOSES   Final diagnoses:  Nonspecific chest pain       Governor Rooks, MD 04/10/15 1650

## 2015-04-10 NOTE — ED Notes (Signed)
Pt c/o intermittent episodes of upper left chest pressure and tightness; today was a little different-had a warm sensation come across her body; pt says the episodes come on quickly and leave quickly; pain right low down left arm; pt says "i'm shaking like crazy";

## 2015-04-10 NOTE — ED Notes (Signed)
Pt states she had a feeling of chest pain radiating to her left arm along with a flushed feeling this AM, denies any pain at this time, pt states she had a similar feeling about a month ago and saw her PCP and had an EKG preformed and was cleared, pt in no acute distress at this time

## 2015-04-18 ENCOUNTER — Ambulatory Visit (INDEPENDENT_AMBULATORY_CARE_PROVIDER_SITE_OTHER): Payer: BLUE CROSS/BLUE SHIELD | Admitting: Family Medicine

## 2015-04-18 ENCOUNTER — Encounter: Payer: Self-pay | Admitting: Family Medicine

## 2015-04-18 ENCOUNTER — Other Ambulatory Visit (HOSPITAL_COMMUNITY)
Admission: RE | Admit: 2015-04-18 | Discharge: 2015-04-18 | Disposition: A | Discharge status: Home / Self Care | Payer: BLUE CROSS/BLUE SHIELD | Source: Ambulatory Visit | Attending: Family Medicine | Admitting: Family Medicine

## 2015-04-18 VITALS — BP 114/70 | HR 90 | Temp 98.2°F | Ht 66.75 in | Wt 131.0 lb

## 2015-04-18 DIAGNOSIS — Z0000 Encounter for general adult medical examination without abnormal findings: Secondary | ICD-10-CM | POA: Diagnosis not present

## 2015-04-18 DIAGNOSIS — R079 Chest pain, unspecified: Secondary | ICD-10-CM | POA: Insufficient documentation

## 2015-04-18 DIAGNOSIS — Z23 Encounter for immunization: Secondary | ICD-10-CM | POA: Diagnosis not present

## 2015-04-18 DIAGNOSIS — Z1151 Encounter for screening for human papillomavirus (HPV): Secondary | ICD-10-CM | POA: Insufficient documentation

## 2015-04-18 DIAGNOSIS — Z01411 Encounter for gynecological examination (general) (routine) with abnormal findings: Secondary | ICD-10-CM | POA: Insufficient documentation

## 2015-04-18 DIAGNOSIS — Z01419 Encounter for gynecological examination (general) (routine) without abnormal findings: Secondary | ICD-10-CM | POA: Insufficient documentation

## 2015-04-18 LAB — CBC WITH DIFFERENTIAL/PLATELET
Basophils Absolute: 0 10*3/uL (ref 0.0–0.1)
Basophils Relative: 0.3 % (ref 0.0–3.0)
Eosinophils Absolute: 0 10*3/uL (ref 0.0–0.7)
Eosinophils Relative: 0.1 % (ref 0.0–5.0)
HCT: 35.8 % — ABNORMAL LOW (ref 36.0–46.0)
Hemoglobin: 12.2 g/dL (ref 12.0–15.0)
Lymphocytes Relative: 16.6 % (ref 12.0–46.0)
Lymphs Abs: 0.9 10*3/uL (ref 0.7–4.0)
MCHC: 34.1 g/dL (ref 30.0–36.0)
MCV: 94.8 fl (ref 78.0–100.0)
Monocytes Absolute: 0.3 10*3/uL (ref 0.1–1.0)
Monocytes Relative: 5.3 % (ref 3.0–12.0)
Neutro Abs: 4 10*3/uL (ref 1.4–7.7)
Neutrophils Relative %: 77.7 % — ABNORMAL HIGH (ref 43.0–77.0)
Platelets: 186 10*3/uL (ref 150.0–400.0)
RBC: 3.78 Mil/uL — ABNORMAL LOW (ref 3.87–5.11)
RDW: 12.4 % (ref 11.5–15.5)
WBC: 5.2 10*3/uL (ref 4.0–10.5)

## 2015-04-18 LAB — VITAMIN D 25 HYDROXY (VIT D DEFICIENCY, FRACTURES): VITD: 21.91 ng/mL — ABNORMAL LOW (ref 30.00–100.00)

## 2015-04-18 LAB — LIPID PANEL
Cholesterol: 141 mg/dL (ref 0–200)
HDL: 57.1 mg/dL (ref >39.00)
LDL Cholesterol: 74 mg/dL (ref 0–99)
NonHDL: 83.6
Total CHOL/HDL Ratio: 2
Triglycerides: 47 mg/dL (ref 0.0–149.0)
VLDL: 9.4 mg/dL (ref 0.0–40.0)

## 2015-04-18 LAB — TSH: TSH: 1.25 u[IU]/mL (ref 0.35–4.50)

## 2015-04-18 LAB — COMPREHENSIVE METABOLIC PANEL
ALT: 10 U/L (ref 0–35)
AST: 15 U/L (ref 0–37)
Albumin: 4.5 g/dL (ref 3.5–5.2)
Alkaline Phosphatase: 43 U/L (ref 39–117)
BUN: 10 mg/dL (ref 6–23)
CO2: 24 mEq/L (ref 19–32)
Calcium: 9.6 mg/dL (ref 8.4–10.5)
Chloride: 104 mEq/L (ref 96–112)
Creatinine, Ser: 0.78 mg/dL (ref 0.40–1.20)
GFR: 85.43 mL/min (ref >60.00)
Glucose, Bld: 83 mg/dL (ref 70–99)
Potassium: 3.7 mEq/L (ref 3.5–5.1)
Sodium: 138 mEq/L (ref 135–145)
Total Bilirubin: 0.5 mg/dL (ref 0.2–1.2)
Total Protein: 7.5 g/dL (ref 6.0–8.3)

## 2015-04-18 LAB — VITAMIN B12: Vitamin B-12: 321 pg/mL (ref 211–911)

## 2015-04-18 MED ORDER — CYCLOBENZAPRINE HCL 10 MG PO TABS: 10.0000 mg | ORAL_TABLET | Freq: Every evening | ORAL | Status: DC | PRN

## 2015-04-18 NOTE — Assessment & Plan Note (Signed)
Reviewed preventive care protocols, scheduled due services, and updated immunizations Discussed nutrition, exercise, diet, and healthy lifestyle.  Discussed USPSTF recommendations of cervical cancer screening.  She is aware that interval of 3 years is recommended but pt would prefer to have pap smear done today.  Labs today.  Influenza vaccine today.  Orders Placed This Encounter  Procedures  . Flu Vaccine QUAD 36+ mos PF IM (Fluarix & Fluzone Quad PF)  . CBC with Differential/Platelet  . Comprehensive metabolic panel  . Lipid panel  . TSH  . Vitamin D, 25-hydroxy  . Vitamin B12

## 2015-04-18 NOTE — Progress Notes (Signed)
Subjective:   Patient ID: Abigail Choi, female    DOB: 03/12/1971, 44 y.o.   MRN: [id number]  Abigail Choi is a pleasant 44 y.o. year old female who presents to clinic today with Annual Exam  on 04/18/2015  HPI:  Mammogram scheduled for 05/04/15 No h/o abnormal pap smears recently. Last pap smear done by me on 03/27/14.  Was seen in ER for chest pain on 12/6- notes reviewed. Cardiac work up was negative.  Was referred to cardiologist for further testing since she has had multiple similar episodes. It was presumed this episode was due to anxiety and caffeine intake.  [facility] who recommended stress test and echo.  Lab Results  Component Value Date   CHOL 174 03/27/2014   HDL 57.40 03/27/2014   LDLCALC 107* 03/27/2014   TRIG 46.0 03/27/2014   CHOLHDL 3 03/27/2014   Lab Results  Component Value Date   CREATININE 0.81 04/10/2015   Lab Results  Component Value Date   NA 139 04/10/2015   K 3.8 04/10/2015   CL 106 04/10/2015   CO2 26 04/10/2015   Lab Results  Component Value Date   WBC 6.4 04/10/2015   HGB 12.6 04/10/2015   HCT 37.2 04/10/2015   MCV 94.3 04/10/2015   PLT 177 04/10/2015   Lab Results  Component Value Date   TSH 1.83 03/27/2014   No current outpatient prescriptions on file prior to visit.   No current facility-administered medications on file prior to visit.    No Known Allergies  Past Medical History  Diagnosis Date  . RUQ abdominal pain   . Frequent headaches     No past surgical history on file.  Family History  Problem Relation Age of Onset  . Hypertension Father   . Heart disease Maternal Grandfather   . Cancer Paternal Grandmother   . Hypertension Paternal Grandfather   . Diabetes Paternal Grandfather     Social History   Social History  . Marital Status: Married    Spouse Name: N/A  . Number of Children: N/A  . Years of Education: N/A   Occupational History  . Not on file.    Social History Main Topics  . Smoking status: Never Smoker   . Smokeless tobacco: Never Used  . Alcohol Use: No  . Drug Use: No  . Sexual Activity: Yes    Birth Control/ Protection: IUD   Other Topics Concern  . Not on file   Social History Narrative   The PMH, PSH, Social History, Family History, Medications, and allergies [facility], and have been updated if relevant.   Review of Systems  Constitutional: Negative.   HENT: Negative.   Respiratory: Negative.   Cardiovascular: Positive for chest pain. Negative for palpitations.  Gastrointestinal: Negative.   Endocrine: Negative.   Genitourinary: Negative.   Musculoskeletal: Negative.   Skin: Negative.   Allergic/Immunologic: Negative.   Neurological: Negative.   Hematological: Negative.   Psychiatric/Behavioral: Negative.   All other systems reviewed and are negative.      Objective:    BP 114/70 mmHg  Pulse 90  Temp(Src) 98.2 F (36.8 C) (Oral)  Ht 5' 6.75" (1.695 m)  Wt 131 lb (59.421 kg)  BMI 20.68 kg/m2  SpO2 98%   Physical Exam   General:  Well-developed,well-nourished,in no acute distress; alert,appropriate and cooperative throughout examination Head:  normocephalic and atraumatic.   Eyes:  vision grossly intact, pupils equal, pupils  round, and pupils reactive to light.   Ears:  R ear normal and L ear normal.   Nose:  no external deformity.   Mouth:  good dentition.   Neck:  No deformities, masses, or tenderness noted. Breasts:  No mass, nodules, thickening, tenderness, bulging, retraction, inflamation, nipple discharge or skin changes noted.   Lungs:  Normal respiratory effort, chest expands symmetrically. Lungs are clear to auscultation, no crackles or wheezes. Heart:  Normal rate and regular rhythm. S1 and S2 normal without gallop, murmur, click, rub or other extra sounds. Abdomen:  Bowel sounds positive,abdomen soft and non-tender without masses, organomegaly or hernias  noted. Rectal:  no external abnormalities.   Genitalia:  Pelvic Exam:        External: normal female genitalia without lesions or masses        Vagina: normal without lesions or masses        Cervix: normal without lesions or masses        Adnexa: normal bimanual exam without masses or fullness        Uterus: normal by palpation        Pap smear: performed Msk:  No deformity or scoliosis noted of thoracic or lumbar spine.   Extremities:  No clubbing, cyanosis, edema, or deformity noted with normal full range of motion of all joints.   Neurologic:  alert & oriented X3 and gait normal.   Skin:  Intact without suspicious lesions or rashes Cervical Nodes:  No lymphadenopathy noted Axillary Nodes:  No palpable lymphadenopathy Psych:  Cognition and judgment appear intact. Alert and cooperative with normal attention span and concentration. No apparent delusions, illusions, hallucinations       Assessment & Plan:   Well woman exam - Plan: CBC with Differential/Platelet, Comprehensive metabolic panel, Lipid panel, TSH, Vitamin D, 25-hydroxy, Vitamin B12  Need for influenza vaccination - Plan: Flu Vaccine QUAD 36+ mos PF IM (Fluarix & Fluzone Quad PF)  Chest pain, unspecified chest pain type No Follow-up on file.

## 2015-04-18 NOTE — Patient Instructions (Signed)
Great to see you. Happy Holidays. Please keep me updated. We will call you with your results form today.

## 2015-04-19 LAB — CYTOLOGY - PAP

## 2015-04-24 ENCOUNTER — Other Ambulatory Visit: Payer: Self-pay | Admitting: Family Medicine

## 2015-04-24 DIAGNOSIS — N870 Mild cervical dysplasia: Secondary | ICD-10-CM

## 2015-05-03 ENCOUNTER — Ambulatory Visit (INDEPENDENT_AMBULATORY_CARE_PROVIDER_SITE_OTHER): Payer: BLUE CROSS/BLUE SHIELD | Admitting: Obstetrics & Gynecology

## 2015-05-03 ENCOUNTER — Encounter: Payer: Self-pay | Admitting: Obstetrics & Gynecology

## 2015-05-03 ENCOUNTER — Encounter: Payer: Self-pay | Admitting: *Deleted

## 2015-05-03 VITALS — BP 107/77 | HR 96 | Resp 18 | Ht 67.0 in | Wt 128.0 lb

## 2015-05-03 DIAGNOSIS — Z01812 Encounter for preprocedural laboratory examination: Secondary | ICD-10-CM

## 2015-05-03 DIAGNOSIS — IMO0002 Reserved for concepts with insufficient information to code with codable children: Secondary | ICD-10-CM

## 2015-05-03 DIAGNOSIS — R87612 Low grade squamous intraepithelial lesion on cytologic smear of cervix (LGSIL): Secondary | ICD-10-CM | POA: Diagnosis not present

## 2015-05-03 LAB — POCT URINE PREGNANCY: Preg Test, Ur: NEGATIVE

## 2015-05-03 NOTE — Progress Notes (Signed)
   Subjective:    Patient ID: Corinda Gublerngela S Zeigler, female    DOB: 07/15/1970, 44 y.o.   MRN: [id number]  HPI MW P3 (all daughters) here with a LGSIL negative HPV pap. She was offered repeat pap versus colpo by Dr. Dayton MartesAron and she opted for colpo.  Review of Systems She uses Mirena (43 yo) to control her menorrhagia. Her husband has had a vasectomy.    Objective:   Physical Exam [id number] Breathing, conversing, and ambulating normally UPT negative, consent signed, time out done Cervix prepped with acetic acid. Transformation zone seen in its entirety. Colpo adequate. Entirely normal ectocervix ECC obtained. She tolerated the procedure well.         Assessment & Plan:  LGSIL, Negative HPV, normal colpo If ECC negative, then pap with cotesting in 1 year

## 2015-05-04 ENCOUNTER — Ambulatory Visit
Admission: RE | Admit: 2015-05-04 | Discharge: 2015-05-04 | Disposition: A | Discharge status: Home / Self Care | Payer: BLUE CROSS/BLUE SHIELD | Source: Ambulatory Visit

## 2015-05-04 DIAGNOSIS — Z1231 Encounter for screening mammogram for malignant neoplasm of breast: Secondary | ICD-10-CM

## 2015-05-11 ENCOUNTER — Ambulatory Visit (INDEPENDENT_AMBULATORY_CARE_PROVIDER_SITE_OTHER): Payer: BLUE CROSS/BLUE SHIELD | Admitting: Obstetrics & Gynecology

## 2015-05-11 DIAGNOSIS — N870 Mild cervical dysplasia: Secondary | ICD-10-CM | POA: Diagnosis not present

## 2015-05-11 DIAGNOSIS — IMO0002 Reserved for concepts with insufficient information to code with codable children: Secondary | ICD-10-CM

## 2015-05-11 NOTE — Progress Notes (Signed)
   Subjective:    Patient ID: Abigail Choi, female    DOB: 06/14/1970, 45 y.o.   MRN: [id number]  HPI  45 yo MW P3 here to discuss her ECC results. She had a LGSIL pap and a normal colpo. 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   Review of Systems     Objective:   Physical Exam  WNWHWFNAD Breathing, conversing, and ambulating normally      Assessment & Plan:  + ECC- plan for LEEP/ECC

## 2015-05-16 ENCOUNTER — Encounter: Payer: Self-pay | Admitting: *Deleted

## 2015-05-16 ENCOUNTER — Ambulatory Visit (INDEPENDENT_AMBULATORY_CARE_PROVIDER_SITE_OTHER): Payer: BLUE CROSS/BLUE SHIELD | Admitting: Obstetrics & Gynecology

## 2015-05-16 ENCOUNTER — Encounter: Payer: Self-pay | Admitting: Obstetrics & Gynecology

## 2015-05-16 VITALS — BP 101/69 | HR 80 | Resp 18 | Wt 131.0 lb

## 2015-05-16 DIAGNOSIS — Z01812 Encounter for preprocedural laboratory examination: Secondary | ICD-10-CM

## 2015-05-16 DIAGNOSIS — N870 Mild cervical dysplasia: Secondary | ICD-10-CM

## 2015-05-16 LAB — POCT URINE PREGNANCY: Preg Test, Ur: NEGATIVE

## 2015-05-16 NOTE — Progress Notes (Signed)
   Subjective:    Patient ID: Abigail Choi, female    DOB: 07/23/1970, 45 y.o.   MRN: [id number]  HPI  45 yo MW P3 with a + ECC is here for a LEEP.  Review of Systems     Objective:   Physical Exam  UPT negative, consent signed, time out done Cervix prepped with acetic acid. Mirena strings tucked up inside the cervix. I used 9 cc of 1% lidocaine with epi to numb her cervix. I then removed a portion of the anterior and posterior lips of the cervix.  I applied cautery for hemostasis. I covered the cervix with Monsel's solution. ECC obtained. She tolerated the procedure well.      Assessment & Plan:  +ECC- LEEP Await pathology

## 2015-07-09 ENCOUNTER — Encounter: Payer: Self-pay | Admitting: Family Medicine

## 2015-07-09 ENCOUNTER — Ambulatory Visit (INDEPENDENT_AMBULATORY_CARE_PROVIDER_SITE_OTHER): Payer: BLUE CROSS/BLUE SHIELD | Admitting: Family Medicine

## 2015-07-09 VITALS — BP 110/52 | HR 79 | Temp 98.2°F | Wt 130.5 lb

## 2015-07-09 DIAGNOSIS — J020 Streptococcal pharyngitis: Secondary | ICD-10-CM | POA: Diagnosis not present

## 2015-07-09 LAB — POCT INFLUENZA A/B
Influenza A, POC: NEGATIVE
Influenza B, POC: NEGATIVE

## 2015-07-09 LAB — POCT RAPID STREP A (OFFICE): Rapid Strep A Screen: POSITIVE — AB

## 2015-07-09 MED ORDER — PENICILLIN V POTASSIUM 500 MG PO TABS: 500.0000 mg | ORAL_TABLET | Freq: Three times a day (TID) | ORAL | Status: DC

## 2015-07-09 NOTE — Patient Instructions (Signed)
Good to see you. Please take penicillin as directed- 1 tablet three times daily x 10 days.  Strep Throat Strep throat is a bacterial infection of the throat. Your health care provider may call the infection tonsillitis or pharyngitis, depending on whether there is swelling in the tonsils or at the back of the throat. Strep throat is most common during the cold months of the year in children who are 44-44 years of age, but it can happen during any season in people of any age. This infection is spread from person to person (contagious) through coughing, sneezing, or close contact. CAUSES Strep throat is caused by the bacteria called Streptococcus pyogenes. RISK FACTORS This condition is more likely to develop in:  People who spend time in crowded places where the infection can spread easily.  People who have close contact with someone who has strep throat. SYMPTOMS Symptoms of this condition include:  Fever or chills.   Redness, swelling, or pain in the tonsils or throat.  Pain or difficulty when swallowing.  White or yellow spots on the tonsils or throat.  Swollen, tender glands in the neck or under the jaw.  Red rash all over the body (rare). DIAGNOSIS This condition is diagnosed by performing a rapid strep test or by taking a swab of your throat (throat culture test). Results from a rapid strep test are usually ready in a few minutes, but throat culture test results are available after one or two days. TREATMENT This condition is treated with antibiotic medicine. HOME CARE INSTRUCTIONS Medicines  Take over-the-counter and prescription medicines only as told by your health care provider.  Take your antibiotic as told by your health care provider. Do not stop taking the antibiotic even if you start to feel better.  Have family members who also have a sore throat or fever tested for strep throat. They may need antibiotics if they have the strep infection. Eating and  Drinking  Do not share food, drinking cups, or personal items that could cause the infection to spread to other people.  If swallowing is difficult, try eating soft foods until your sore throat feels better.  Drink enough fluid to keep your urine clear or pale yellow. General Instructions  Gargle with a salt-water mixture 3-4 times per day or as needed. To make a salt-water mixture, completely dissolve -1 tsp of salt in 1 cup of warm water.  Make sure that all household members wash their hands well.  Get plenty of rest.  Stay home from school or work until you have been taking antibiotics for 24 hours.  Keep all follow-up visits as told by your health care provider. This is important. SEEK MEDICAL CARE IF:  The glands in your neck continue to get bigger.  You develop a rash, cough, or earache.  You cough up a thick liquid that is green, yellow-brown, or bloody.  You have pain or discomfort that does not get better with medicine.  Your problems seem to be getting worse rather than better.  You have a fever. SEEK IMMEDIATE MEDICAL CARE IF:  You have new symptoms, such as vomiting, severe headache, stiff or painful neck, chest pain, or shortness of breath.  You have severe throat pain, drooling, or changes in your voice.  You have swelling of the neck, or the skin on the neck becomes red and tender.  You have signs of dehydration, such as fatigue, dry mouth, and decreased urination.  You become increasingly sleepy, or you cannot wake  up completely.  Your joints become red or painful.   This information is not intended to replace advice given to you by your health care provider. Make sure you discuss any questions you have with your health care provider.   Document Released: 04/18/2000 Document Revised: 01/10/2015 Document Reviewed: 08/14/2014 Elsevier Interactive Patient Education [employer].

## 2015-07-09 NOTE — Progress Notes (Signed)
SUBJECTIVE: 45 y.o. female with sore throat, myalgias, swollen glands, headache and fever for 2 days. No history of rheumatic fever. Other symptoms: none  Current Outpatient Prescriptions on File Prior to Visit  Medication Sig Dispense Refill  . cyclobenzaprine (FLEXERIL) 10 MG tablet Take 1 tablet (10 mg total) by mouth at bedtime as needed for muscle spasms. 30 tablet 5   No current facility-administered medications on file prior to visit.    No Known Allergies  Past Medical History  Diagnosis Date  . RUQ abdominal pain   . Frequent headaches     No past surgical history on file.  Family History  Problem Relation Age of Onset  . Hypertension Father   . Heart disease Maternal Grandfather   . Cancer Paternal Grandmother   . Hypertension Paternal Grandfather   . Diabetes Paternal Grandfather     Social History   Social History  . Marital Status: Married    Spouse Name: N/A  . Number of Children: N/A  . Years of Education: N/A   Occupational History  . Not on file.   Social History Main Topics  . Smoking status: Never Smoker   . Smokeless tobacco: Never Used  . Alcohol Use: No  . Drug Use: No  . Sexual Activity: Yes    Birth Control/ Protection: IUD   Other Topics Concern  . Not on file   Social History Narrative   The PMH, PSH, Social History, Family History, Medications, and allergies [facility], and have been updated if relevant.  OBJECTIVE:  BP 110/52 mmHg  Pulse 79  Temp(Src) 98.2 F (36.8 C) (Oral)  Wt 130 lb 8 oz (59.194 kg)  SpO2 98%  Vitals as noted above. Appears alert, well appearing, and in no distress, oriented to person, place, and time and normal appearing weight. Ears: bilateral TM's and external ear canals normal Oropharynx: erythematous and exudate noted Neck: bilateral symmetric anterior adenopathy Lungs: clear to auscultation, no wheezes, rales or rhonchi, symmetric air entry Rapid Strep test is  positive  ASSESSMENT: Streptococcal pharyngitis  PLAN: Per orders. Gargle, use acetaminophen or other OTC analgesic, and take Rx fully as prescribed. Call if other family members develop similar symptoms. See prn.

## 2015-07-09 NOTE — Progress Notes (Signed)
Pre visit review using our clinic review tool, if applicable. No additional management support is needed unless otherwise documented below in the visit note. 

## 2015-07-09 NOTE — Addendum Note (Signed)
Addended by: Desmond DikeKNIGHT, Arkie Tagliaferro H on: 07/09/2015 11:25 AM   Modules accepted: Orders

## 2015-11-26 IMAGING — MG MM SCREENING BREAST TOMO BILATERAL
8 series · 8 of 24 positions shown · non-contrast
Comparison: Previous exam(s).

CLINICAL DATA: Screening.

EXAM:
DIGITAL SCREENING BILATERAL MAMMOGRAM WITH 3D TOMO WITH CAD

[L MLO]
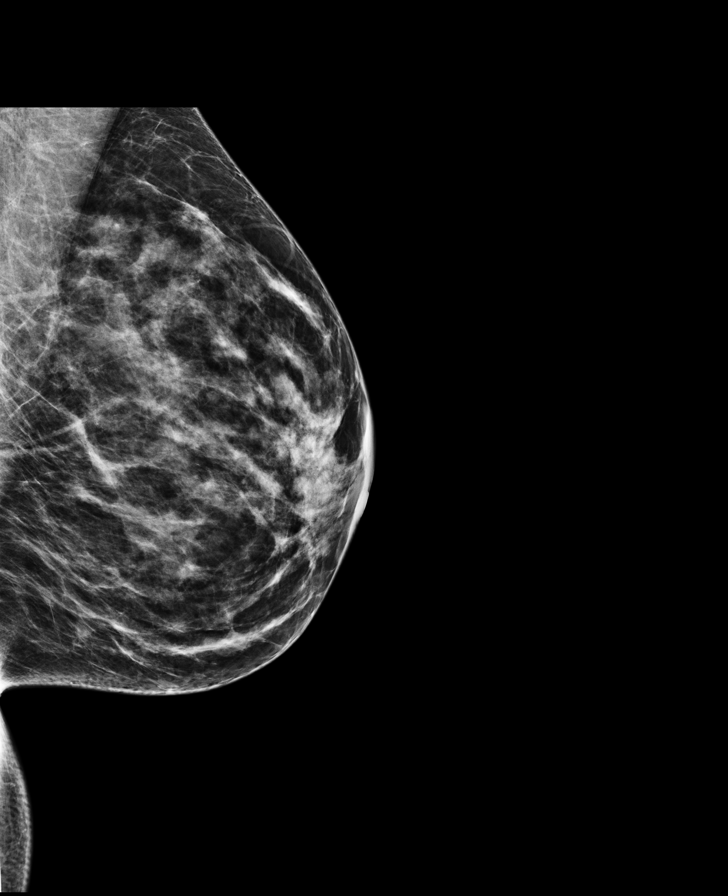

[L CC]
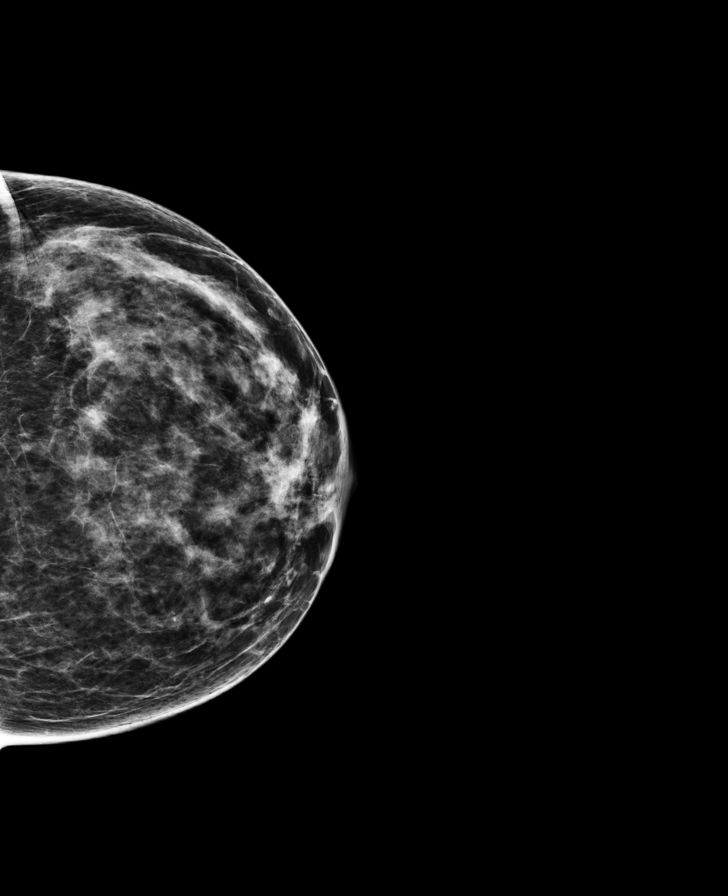

[R MLO]
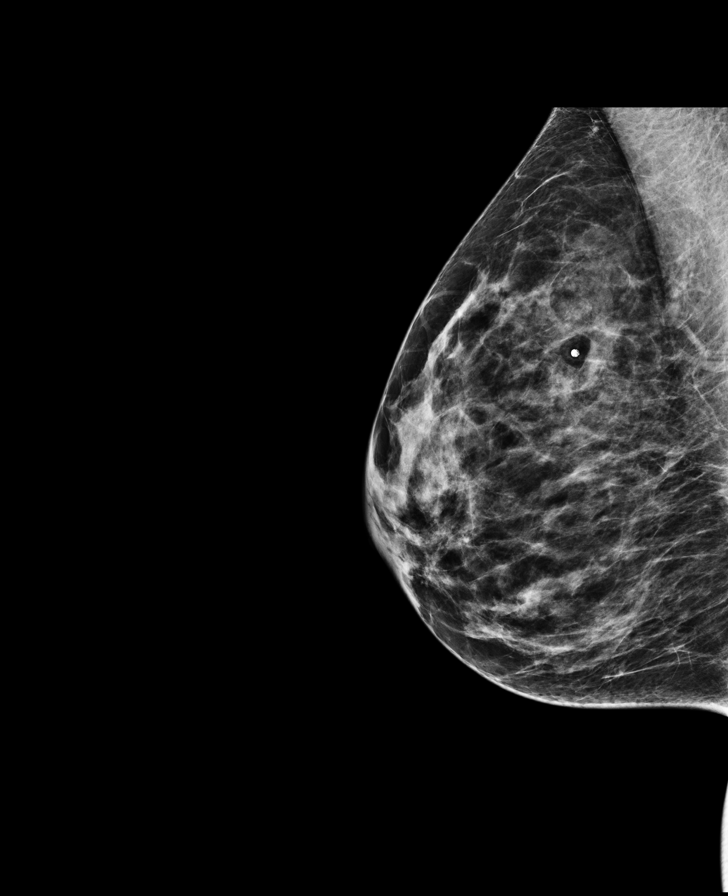

[R CC]
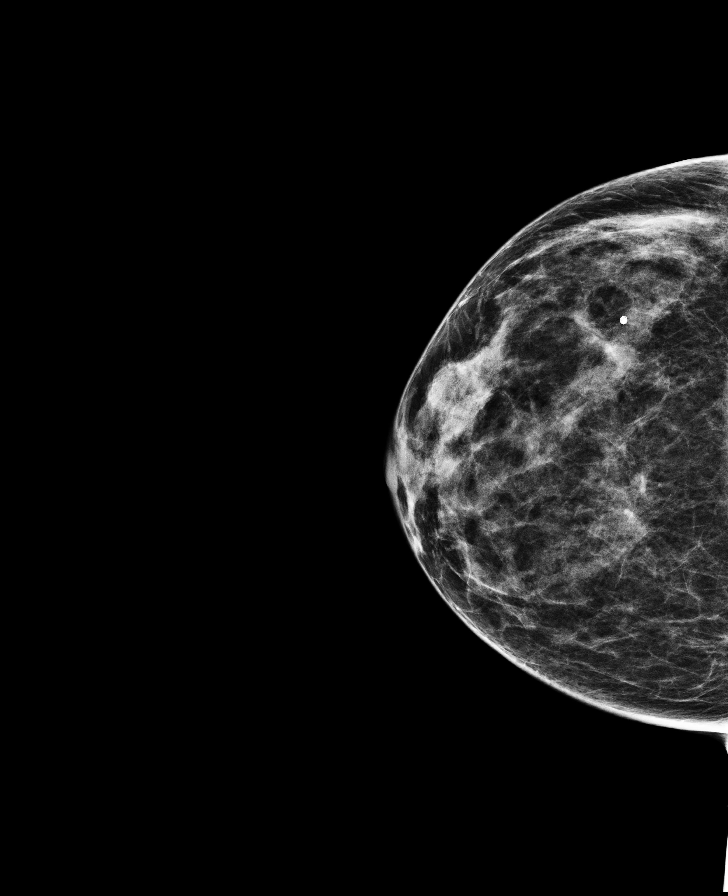

[R CC tomo · tomo slice 29/57.0]
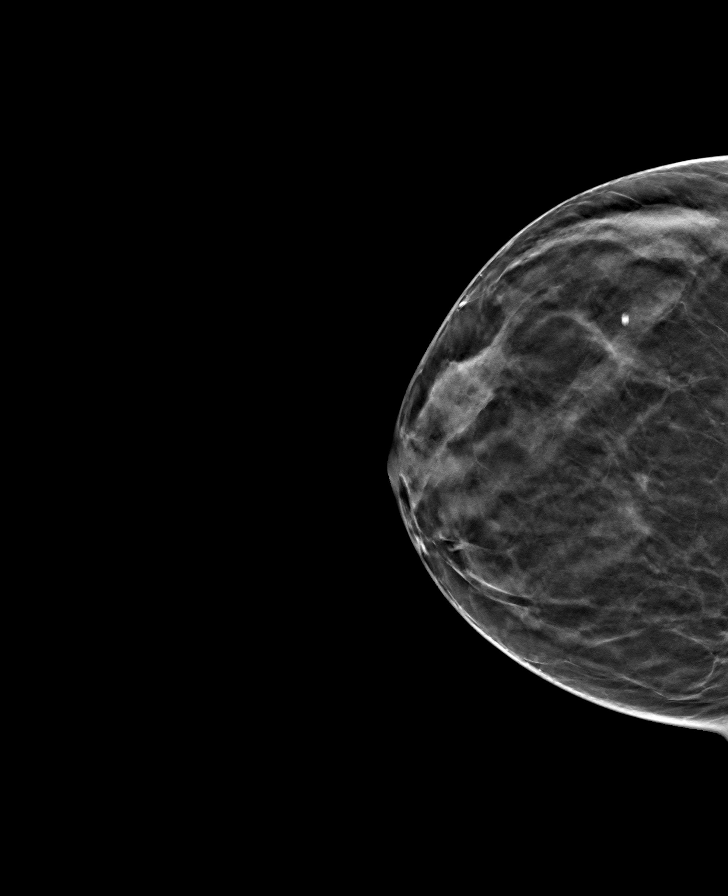

[L MLO tomo · tomo slice 28/55.0]
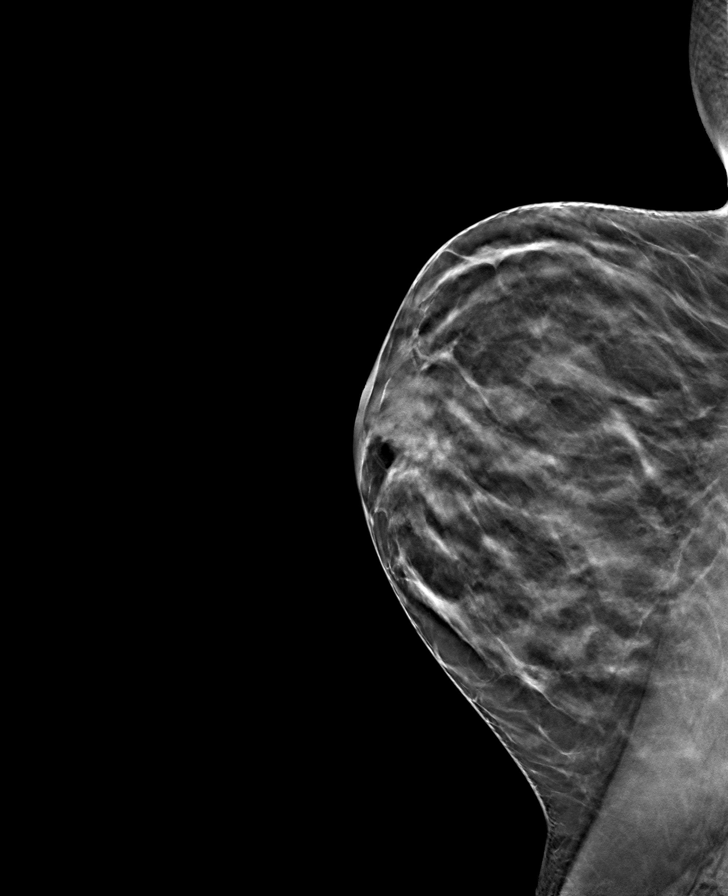

[L CC tomo · tomo slice 30/59.0]
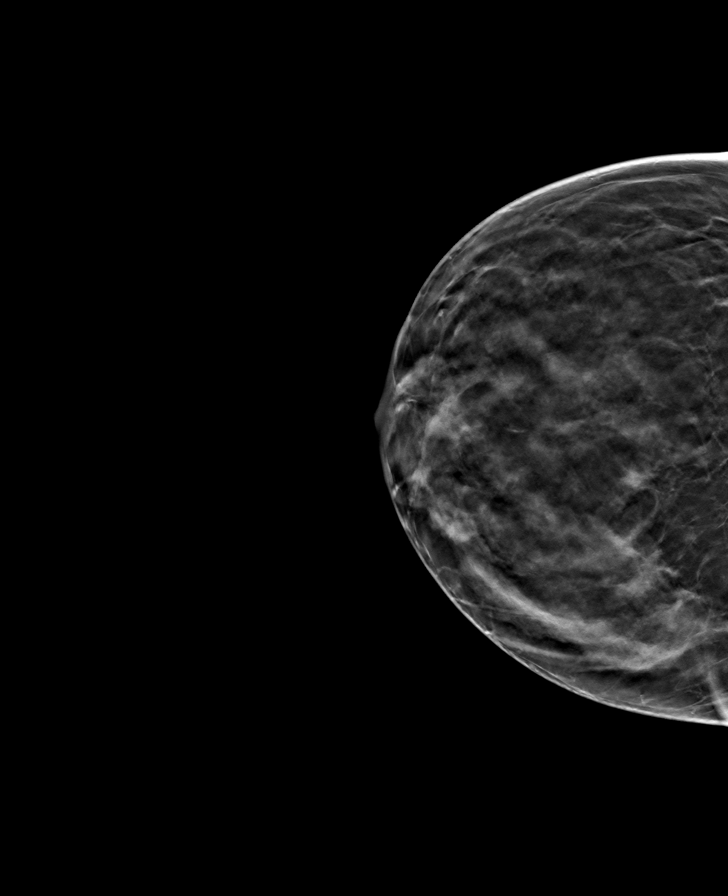

[R MLO tomo · tomo slice 28/55.0]
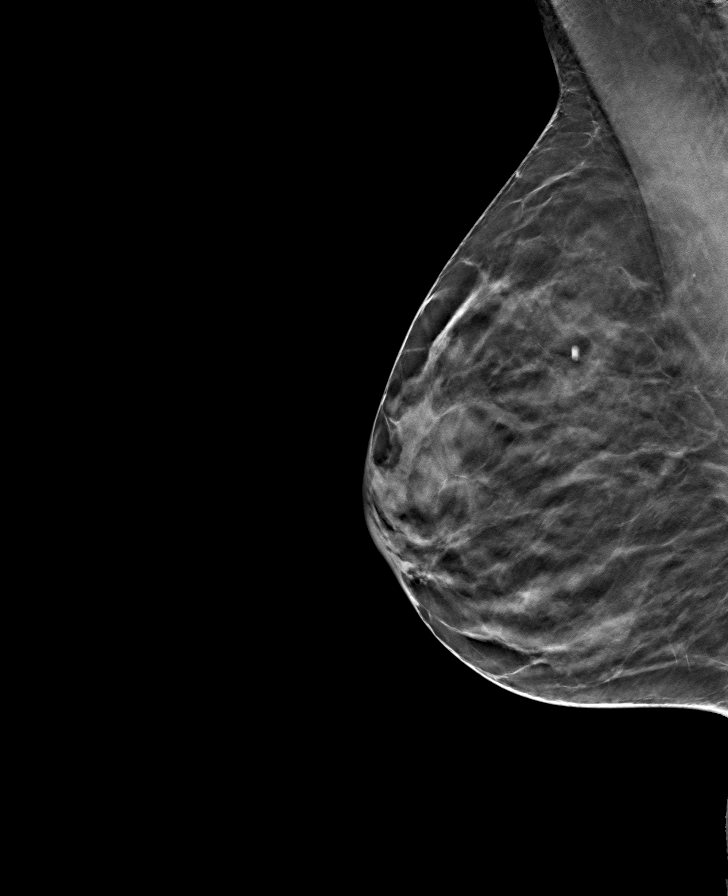

[8 of 24 positions shown; findings below may reference images not displayed]

ACR Breast Density Category c: The breast tissue is heterogeneously
dense, which may obscure small masses.
FINDINGS: There are no findings suspicious for malignancy. Images were
processed with CAD.
IMPRESSION: No mammographic evidence of malignancy. A result letter of this
screening mammogram will be mailed directly to the patient.

RECOMMENDATION:
Screening mammogram in one year. (Code:OA-G-1SS)

BI-RADS CATEGORY  1: Negative.

## 2016-06-10 ENCOUNTER — Other Ambulatory Visit: Payer: Self-pay | Admitting: Obstetrics and Gynecology

## 2016-06-10 DIAGNOSIS — Z130 Encounter for screening for diseases of the blood and blood-forming organs and certain disorders involving the immune mechanism: Secondary | ICD-10-CM | POA: Diagnosis not present

## 2016-06-10 DIAGNOSIS — Z6821 Body mass index (BMI) 21.0-21.9, adult: Secondary | ICD-10-CM | POA: Diagnosis not present

## 2016-06-10 DIAGNOSIS — Z1231 Encounter for screening mammogram for malignant neoplasm of breast: Secondary | ICD-10-CM | POA: Diagnosis not present

## 2016-06-10 DIAGNOSIS — Z124 Encounter for screening for malignant neoplasm of cervix: Secondary | ICD-10-CM | POA: Diagnosis not present

## 2016-06-10 DIAGNOSIS — Z13220 Encounter for screening for lipoid disorders: Secondary | ICD-10-CM | POA: Diagnosis not present

## 2016-06-10 DIAGNOSIS — Z1329 Encounter for screening for other suspected endocrine disorder: Secondary | ICD-10-CM | POA: Diagnosis not present

## 2016-06-10 DIAGNOSIS — Z01419 Encounter for gynecological examination (general) (routine) without abnormal findings: Secondary | ICD-10-CM | POA: Diagnosis not present

## 2016-06-12 LAB — CYTOLOGY - PAP

## 2016-07-03 DIAGNOSIS — Z6821 Body mass index (BMI) 21.0-21.9, adult: Secondary | ICD-10-CM | POA: Diagnosis not present

## 2016-07-03 DIAGNOSIS — Z30433 Encounter for removal and reinsertion of intrauterine contraceptive device: Secondary | ICD-10-CM | POA: Diagnosis not present

## 2016-08-14 DIAGNOSIS — Z6821 Body mass index (BMI) 21.0-21.9, adult: Secondary | ICD-10-CM | POA: Diagnosis not present

## 2016-08-14 DIAGNOSIS — Z30431 Encounter for routine checking of intrauterine contraceptive device: Secondary | ICD-10-CM | POA: Diagnosis not present

## 2017-01-09 DIAGNOSIS — D2261 Melanocytic nevi of right upper limb, including shoulder: Secondary | ICD-10-CM | POA: Diagnosis not present

## 2017-01-09 DIAGNOSIS — L821 Other seborrheic keratosis: Secondary | ICD-10-CM | POA: Diagnosis not present

## 2017-01-09 DIAGNOSIS — Z86018 Personal history of other benign neoplasm: Secondary | ICD-10-CM | POA: Diagnosis not present

## 2017-01-09 DIAGNOSIS — Z23 Encounter for immunization: Secondary | ICD-10-CM | POA: Diagnosis not present

## 2017-01-09 DIAGNOSIS — D225 Melanocytic nevi of trunk: Secondary | ICD-10-CM | POA: Diagnosis not present

## 2017-01-27 ENCOUNTER — Ambulatory Visit (INDEPENDENT_AMBULATORY_CARE_PROVIDER_SITE_OTHER): Payer: BLUE CROSS/BLUE SHIELD | Admitting: Family Medicine

## 2017-01-27 VITALS — BP 118/76 | HR 82 | Temp 97.8°F | Wt 128.8 lb

## 2017-01-27 DIAGNOSIS — R1013 Epigastric pain: Secondary | ICD-10-CM | POA: Insufficient documentation

## 2017-01-27 DIAGNOSIS — R079 Chest pain, unspecified: Secondary | ICD-10-CM | POA: Diagnosis not present

## 2017-01-27 NOTE — Assessment & Plan Note (Signed)
EKG NSR and reassuring. Unlikely cardiac, and seems more concentrated to her upper mid back today. Advised NSAIDs with food for next 3 days. Call or return to clinic prn if these symptoms worsen or fail to improve as anticipated. The patient indicates understanding of these issues and agrees with the plan.

## 2017-01-27 NOTE — Progress Notes (Signed)
Subjective:   Patient ID: Abigail Choi, female    DOB: 29-Dec-1970, 46 y.o.   MRN: [id number]  Abigail Choi is a pleasant 46 y.o. year old female who presents to clinic today with Back Pain  on 01/27/2017  HPI:  Intermittent epigastric pain and back pain.  Neg cardiac work up in 2016.   She has been exercising more.  Wants to make sure it's not her heart, although pain is not typical. It is not brought about by exertion or relieved by rest.  Thinks she may have pulled a muscle but wants to make sure it is not her heart.    No current outpatient prescriptions on file prior to visit.   No current facility-administered medications on file prior to visit.     No Known Allergies  Past Medical History:  Diagnosis Date  . Frequent headaches   . RUQ abdominal pain     No past surgical history on file.  Family History  Problem Relation Age of Onset  . Hypertension Father   . Heart disease Maternal Grandfather   . Cancer Paternal Grandmother   . Hypertension Paternal Grandfather   . Diabetes Paternal Grandfather     Social History   Social History  . Marital status: Married    Spouse name: N/A  . Number of children: N/A  . Years of education: N/A   Occupational History  . Not on file.   Social History Main Topics  . Smoking status: Never Smoker  . Smokeless tobacco: Never Used  . Alcohol use No  . Drug use: No  . Sexual activity: Yes    Birth control/ protection: IUD   Other Topics Concern  . Not on file   Social History Narrative  . No narrative on file   The PMH, PSH, Social History, Family History, Medications, and allergies have been reviewed in [facility], and have been updated if relevant.   Review of Systems  Constitutional: Negative.   Respiratory: Negative for chest tightness and shortness of breath.   Cardiovascular: Positive for chest pain. Negative for palpitations and leg swelling.  Musculoskeletal: Positive for back pain.  Neurological:  Negative.   All other systems reviewed and are negative.      Objective:    BP 118/76 (BP Location: Left Arm, Patient Position: Sitting, Cuff Size: Normal)   Pulse 82   Temp 97.8 F (36.6 C) (Oral)   Wt 128 lb 12 oz (58.4 kg)   SpO2 98%   BMI 20.17 kg/m    Physical Exam   General:  Well-developed,well-nourished,in no acute distress; alert,appropriate and cooperative throughout examination Head:  normocephalic and atraumatic.   Eyes:  vision grossly intact, PERRL Ears:  R ear normal and L ear normal externally Nose:  no external deformity.   Mouth:  good dentition.   Neck:  No deformities, masses, or tenderness noted. Lungs:  Normal respiratory effort, chest expands symmetrically. Lungs are clear to auscultation, no crackles or wheezes. Heart:  Normal rate and regular rhythm. S1 and S2 normal without gallop, murmur, click, rub or other extra sounds.  Extremities:  No clubbing, cyanosis, edema, or deformity noted with normal full range of motion of all joints.   Neurologic:  alert & oriented X3 and gait normal.   Skin:  Intact without suspicious lesions or rashes Psych:  Cognition and judgment appear intact. Alert and cooperative with normal attention span and concentration. No apparent delusions, illusions, hallucinations       Assessment &  Plan:   Chest pain, unspecified type - Plan: EKG 12-Lead  Epigastric pain No Follow-up on file.

## 2017-02-23 DIAGNOSIS — Z23 Encounter for immunization: Secondary | ICD-10-CM | POA: Diagnosis not present

## 2017-05-26 ENCOUNTER — Encounter: Payer: Self-pay | Admitting: Family Medicine

## 2017-05-27 ENCOUNTER — Encounter: Payer: Self-pay | Admitting: Family Medicine

## 2017-05-27 ENCOUNTER — Ambulatory Visit (INDEPENDENT_AMBULATORY_CARE_PROVIDER_SITE_OTHER): Payer: BLUE CROSS/BLUE SHIELD | Admitting: Family Medicine

## 2017-05-27 VITALS — BP 106/66 | HR 68 | Temp 98.2°F | Ht 67.0 in | Wt 131.0 lb

## 2017-05-27 DIAGNOSIS — I8390 Asymptomatic varicose veins of unspecified lower extremity: Secondary | ICD-10-CM | POA: Diagnosis not present

## 2017-05-27 DIAGNOSIS — I83899 Varicose veins of unspecified lower extremities with other complications: Secondary | ICD-10-CM | POA: Insufficient documentation

## 2017-05-27 NOTE — Assessment & Plan Note (Signed)
Reassurance provided. Call or return to clinic prn if these symptoms worsen or fail to improve as anticipated. The patient indicates understanding of these issues and agrees with the plan.

## 2017-05-27 NOTE — Progress Notes (Signed)
Subjective:   Patient ID: Abigail GublerAngela S Pott, female    DOB: 03/29/1971, 47 y.o.   MRN: [id number]  Abigail Choi is a pleasant 47 y.o. year old female who presents to clinic today with Vein in leg (Vein in her right leg-painful to touch yesterday. )  on 05/27/2017  HPI:  See email sent from patient this morning-  Hi Dr. Dayton MartesAron,   Happy New Year! I hope you are enjoying the new office space. I wanted to ask you a quick medical question - no hurry on responding! Yesterday while I was at home cleaning my house, I noticed a lump formed on my right leg, right back side of my knee. It hurt a little and immediately turned purple and bruised. The knot seemed to go away after a few minutes but the bruise is still there...large and very dark ! I have had trouble with the veins in my right leg especially after my third pregnancy. I had a series of vein treatments from [facility] about 7 years ago which has helped to decrease my leg pain significantly. So, could this be simply a varicose vein complication that will heal on its own or do I need to schedule an appointment to come see you or go back to [facility]? Of course I googled it (you know me by now!) and the risk of a blood clot scared me. Would appreciate your recommendation :-)    Current Outpatient Medications on File Prior to Visit  Medication Sig Dispense Refill  . levonorgestrel (MIRENA, 52 MG,) 20 MCG/24HR IUD 1 each by Intrauterine route once.     No current facility-administered medications on file prior to visit.     No Known Allergies  Past Medical History:  Diagnosis Date  . Frequent headaches   . RUQ abdominal pain     No past surgical history on file.  Family History  Problem Relation Age of Onset  . Hypertension Father   . Heart disease Maternal Grandfather   . Cancer Paternal Grandmother   . Hypertension Paternal Grandfather   . Diabetes Paternal Grandfather     Social History   Socioeconomic History    . Marital status: Married    Spouse name: Not on file  . Number of children: Not on file  . Years of education: Not on file  . Highest education level: Not on file  Social Needs  . Financial resource strain: Not on file  . Food insecurity - worry: Not on file  . Food insecurity - inability: Not on file  . Transportation needs - medical: Not on file  . Transportation needs - non-medical: Not on file  Occupational History  . Not on file  Tobacco Use  . Smoking status: Never Smoker  . Smokeless tobacco: Never Used  Substance and Sexual Activity  . Alcohol use: No  . Drug use: No  . Sexual activity: Yes    Birth control/protection: IUD  Other Topics Concern  . Not on file  Social History Narrative  . Not on file   The PMH, PSH, Social History, Family History, Medications, and allergies [facility], and have been updated if relevant.   Review of Systems  Respiratory: Negative.   Cardiovascular: Negative.   Skin: Negative.   All other systems reviewed and are negative.      Objective:    BP 106/66 (BP Location: Left Arm, Patient Position: Sitting, Cuff Size: Normal)   Pulse 68  Temp 98.2 F (36.8 C) (Oral)   Ht 5\' 7"  (1.702 m)   Wt 131 lb (59.4 kg)   SpO2 98%   BMI 20.52 kg/m    Physical Exam  Constitutional: She is oriented to person, place, and time. She appears well-developed and well-nourished. No distress.  HENT:  Head: Normocephalic and atraumatic.  Eyes: Conjunctivae are normal.  Neck: Normal range of motion.  Cardiovascular: Normal rate.  Pulmonary/Chest: Effort normal.  Musculoskeletal:  Now flat, non tender echymosis right calf  Neurological: She is alert and oriented to person, place, and time. No cranial nerve deficit.  Skin: She is not diaphoretic.  Psychiatric: She has a normal mood and affect. Her behavior is normal. Judgment and thought content normal.  Nursing note and vitals reviewed.         Assessment & Plan:    Ruptured varicose vein No Follow-up on file.

## 2017-06-12 DIAGNOSIS — Z1329 Encounter for screening for other suspected endocrine disorder: Secondary | ICD-10-CM | POA: Diagnosis not present

## 2017-06-12 DIAGNOSIS — Z01419 Encounter for gynecological examination (general) (routine) without abnormal findings: Secondary | ICD-10-CM | POA: Diagnosis not present

## 2017-06-12 DIAGNOSIS — Z1231 Encounter for screening mammogram for malignant neoplasm of breast: Secondary | ICD-10-CM | POA: Diagnosis not present

## 2017-06-12 DIAGNOSIS — Z124 Encounter for screening for malignant neoplasm of cervix: Secondary | ICD-10-CM | POA: Diagnosis not present

## 2017-06-12 DIAGNOSIS — Z13220 Encounter for screening for lipoid disorders: Secondary | ICD-10-CM | POA: Diagnosis not present

## 2017-06-12 DIAGNOSIS — Z130 Encounter for screening for diseases of the blood and blood-forming organs and certain disorders involving the immune mechanism: Secondary | ICD-10-CM | POA: Diagnosis not present

## 2017-06-12 DIAGNOSIS — Z6820 Body mass index (BMI) 20.0-20.9, adult: Secondary | ICD-10-CM | POA: Diagnosis not present

## 2017-07-29 ENCOUNTER — Encounter: Payer: Self-pay | Admitting: Family Medicine

## 2017-10-03 ENCOUNTER — Encounter: Payer: Self-pay | Admitting: Family Medicine

## 2017-10-05 ENCOUNTER — Telehealth: Payer: Self-pay | Admitting: Family Medicine

## 2017-10-05 NOTE — Telephone Encounter (Signed)
appt for her daughter is set for 10/13/2017.

## 2017-10-05 NOTE — Telephone Encounter (Signed)
Tried to call pt's mother again.

## 2017-10-05 NOTE — Telephone Encounter (Signed)
Left vm for Abigail Choi to call back, Dr. Dayton MartesAron is Abigail Choi to see her daughter sooner. Please call our office to make an appt on 10/12/2017 at 3:15pm for New pt with Dr. Dayton MartesAron. FYI

## 2018-01-20 DIAGNOSIS — L821 Other seborrheic keratosis: Secondary | ICD-10-CM | POA: Diagnosis not present

## 2018-01-20 DIAGNOSIS — Z86018 Personal history of other benign neoplasm: Secondary | ICD-10-CM | POA: Diagnosis not present

## 2018-01-20 DIAGNOSIS — D225 Melanocytic nevi of trunk: Secondary | ICD-10-CM | POA: Diagnosis not present

## 2018-01-20 DIAGNOSIS — D2261 Melanocytic nevi of right upper limb, including shoulder: Secondary | ICD-10-CM | POA: Diagnosis not present

## 2018-02-09 ENCOUNTER — Ambulatory Visit: Payer: BLUE CROSS/BLUE SHIELD | Admitting: Family Medicine

## 2018-02-09 ENCOUNTER — Encounter: Payer: Self-pay | Admitting: Family Medicine

## 2018-02-09 VITALS — BP 110/60 | HR 94 | Temp 98.8°F | Ht 68.0 in | Wt 127.0 lb

## 2018-02-09 DIAGNOSIS — K219 Gastro-esophageal reflux disease without esophagitis: Secondary | ICD-10-CM | POA: Diagnosis not present

## 2018-02-09 DIAGNOSIS — R079 Chest pain, unspecified: Secondary | ICD-10-CM | POA: Diagnosis not present

## 2018-02-09 LAB — CBC WITH DIFFERENTIAL/PLATELET
Basophils Absolute: 0 10*3/uL (ref 0.0–0.1)
Basophils Relative: 0.8 % (ref 0.0–3.0)
Eosinophils Absolute: 0 10*3/uL (ref 0.0–0.7)
Eosinophils Relative: 0.4 % (ref 0.0–5.0)
HCT: 37.9 % (ref 36.0–46.0)
Hemoglobin: 13 g/dL (ref 12.0–15.0)
Lymphocytes Relative: 18.2 % (ref 12.0–46.0)
Lymphs Abs: 0.7 10*3/uL (ref 0.7–4.0)
MCHC: 34.3 g/dL (ref 30.0–36.0)
MCV: 94.7 fl (ref 78.0–100.0)
Monocytes Absolute: 0.2 10*3/uL (ref 0.1–1.0)
Monocytes Relative: 4.8 % (ref 3.0–12.0)
Neutro Abs: 3.1 10*3/uL (ref 1.4–7.7)
Neutrophils Relative %: 75.8 % (ref 43.0–77.0)
Platelets: 175 10*3/uL (ref 150.0–400.0)
RBC: 4.01 Mil/uL (ref 3.87–5.11)
RDW: 12.3 % (ref 11.5–15.5)
WBC: 4 10*3/uL (ref 4.0–10.5)

## 2018-02-09 LAB — COMPREHENSIVE METABOLIC PANEL
ALT: 8 U/L (ref 0–35)
AST: 13 U/L (ref 0–37)
Albumin: 4.9 g/dL (ref 3.5–5.2)
Alkaline Phosphatase: 49 U/L (ref 39–117)
BUN: 12 mg/dL (ref 6–23)
CO2: 30 mEq/L (ref 19–32)
Calcium: 10.5 mg/dL (ref 8.4–10.5)
Chloride: 104 mEq/L (ref 96–112)
Creatinine, Ser: 0.91 mg/dL (ref 0.40–1.20)
GFR: 70.6 mL/min (ref >60.00)
Glucose, Bld: 93 mg/dL (ref 70–99)
Potassium: 4.3 mEq/L (ref 3.5–5.1)
Sodium: 141 mEq/L (ref 135–145)
Total Bilirubin: 0.6 mg/dL (ref 0.2–1.2)
Total Protein: 8 g/dL (ref 6.0–8.3)

## 2018-02-09 LAB — LIPASE: Lipase: 26 U/L (ref 11.0–59.0)

## 2018-02-09 LAB — H. PYLORI ANTIBODY, IGG: H Pylori IgG: NEGATIVE

## 2018-02-09 LAB — TROPONIN I: TNIDX: 0 ug/l (ref 0.00–0.06)

## 2018-02-09 MED ORDER — RANITIDINE HCL 150 MG PO TABS: 150.0000 mg | ORAL_TABLET | Freq: Two times a day (BID) | ORAL | 3 refills | Status: DC

## 2018-02-09 MED ORDER — OMEPRAZOLE 20 MG PO CPDR: DELAYED_RELEASE_CAPSULE | ORAL | 3 refills | Status: DC

## 2018-02-09 NOTE — Progress Notes (Signed)
Subjective:   Patient ID: Abigail Choi, female    DOB: [birth year], 47 y.o.   MRN: [id number]  Abigail Choi is a pleasant 47 y.o. year old female who presents to clinic today with Gastroesophageal Reflux (Patient is here today C/O indigestion.  Has had Sx for the past 2 years.  Has been cleared from a cardiology stand point.  Several years ago she was on Nexium but nothing since then.  She states that last night the intensity started in her back and she says she thought she was having a heart attack.  It was hurting in her chest too. )  on 02/09/2018  HPI:  ?GERD- has symptoms of indigestion/CP for almost 4 years.  In 04/2015, she was cleared from cardiology-  Dr. Juliann Pares.  She tried Nexium years ago but has taken nothing since then.  She cannot remember if Nexium helped.  She does wake up with burning in her throat.  Last night, she starts to feel chest pain and burning in her back and and felt she was having a heart attack. Not worsened with exertion or relieved by rest. She feels pain is similar to what she felt when she saw cardiology.  No diaphoresis or nausea.  Has been under more stress lately- went back to work after 9 years.  Also ate chick fil a late last night before bed. Current Outpatient Medications on File Prior to Visit  Medication Sig Dispense Refill  . levonorgestrel (MIRENA, 52 MG,) 20 MCG/24HR IUD 1 each by Intrauterine route once.     No current facility-administered medications on file prior to visit.     No Known Allergies  Past Medical History:  Diagnosis Date  . Frequent headaches   . RUQ abdominal pain     No past surgical history on file.  Family History  Problem Relation Age of Onset  . Hypertension Father   . Heart disease Maternal Grandfather   . Cancer Paternal Grandmother   . Hypertension Paternal Grandfather   . Diabetes Paternal Grandfather     Social History   Socioeconomic History  . Marital status: Married    Spouse name:  Not on file  . Number of children: Not on file  . Years of education: Not on file  . Highest education level: Not on file  Occupational History  . Not on file  Social Needs  . Financial resource strain: Not on file  . Food insecurity:    Worry: Not on file    Inability: Not on file  . Transportation needs:    Medical: Not on file    Non-medical: Not on file  Tobacco Use  . Smoking status: Never Smoker  . Smokeless tobacco: Never Used  Substance and Sexual Activity  . Alcohol use: No  . Drug use: No  . Sexual activity: Yes    Birth control/protection: IUD  Lifestyle  . Physical activity:    Days per week: Not on file    Minutes per session: Not on file  . Stress: Not on file  Relationships  . Social connections:    Talks on phone: Not on file    Gets together: Not on file    Attends religious service: Not on file    Active member of club or organization: Not on file    Attends meetings of clubs or organizations: Not on file    Relationship status: Not on file  . Intimate partner violence:    Fear of current  or ex partner: Not on file    Emotionally abused: Not on file    Physically abused: Not on file    Forced sexual activity: Not on file  Other Topics Concern  . Not on file  Social History Narrative  . Not on file   The PMH, PSH, Social History, Family History, Medications, and allergies have been reviewed in [facility], and have been updated if relevant.   Review of Systems  Constitutional: Positive for fatigue.  HENT: Negative.   Eyes: Negative.   Respiratory: Negative.   Cardiovascular: Positive for chest pain. Negative for palpitations and leg swelling.  Gastrointestinal: Negative.   Endocrine: Negative.   Genitourinary: Negative.   Musculoskeletal: Positive for back pain.  Allergic/Immunologic: Negative.   Neurological: Negative.   Hematological: Negative.   Psychiatric/Behavioral: Negative.   All other systems reviewed and are negative.        Objective:    BP 110/60 (BP Location: Left Arm, Patient Position: Sitting, Cuff Size: Normal)   Pulse 94   Temp 98.8 F (37.1 C) (Oral)   Ht 5\' 8"  (1.727 m)   Wt 127 lb (57.6 kg)   SpO2 99%   BMI 19.31 kg/m    Physical Exam  Constitutional: She is oriented to person, place, and time. She appears well-developed and well-nourished. No distress.  HENT:  Head: Normocephalic and atraumatic.  Eyes: EOM are normal.  Neck: Normal range of motion.  Cardiovascular: Normal rate, regular rhythm and normal heart sounds.  Pulmonary/Chest: Effort normal and breath sounds normal.  Abdominal: Soft. Bowel sounds are normal.  Musculoskeletal: Normal range of motion.  Neurological: She is alert and oriented to person, place, and time. No cranial nerve deficit.  Skin: Skin is warm and dry. She is not diaphoretic.  Psychiatric: She has a normal mood and affect. Her behavior is normal. Judgment and thought content normal.  Nursing note and vitals reviewed.         Assessment & Plan:   Chest pain, unspecified type - Plan: EKG 12-Lead, H. pylori antibody, IgG, Lipase, Comprehensive metabolic panel, CBC with Differential/Platelet, Troponin I  Gastroesophageal reflux disease, esophagitis presence not specified No follow-ups on file.

## 2018-02-09 NOTE — Assessment & Plan Note (Addendum)
New- intermittent.  Atypical in nature. EKG reassuring- most likely GERD. Will also check Troponin I  today.

## 2018-02-09 NOTE — Patient Instructions (Addendum)
Food Choices for Gastroesophageal Reflux Disease, Adult When you have gastroesophageal reflux disease (GERD), the foods you eat and your eating habits are very important. Choosing the right foods can help ease your discomfort. What guidelines do I need to follow?  Choose fruits, vegetables, whole grains, and low-fat dairy products.  Choose low-fat meat, fish, and poultry.  Limit fats such as oils, salad dressings, butter, nuts, and avocado.  Keep a food diary. This helps you identify foods that cause symptoms.  Avoid foods that cause symptoms. These may be different for everyone.  Eat small meals often instead of 3 large meals a day.  Eat your meals slowly, in a place where you are relaxed.  Limit fried foods.  Cook foods using methods other than frying.  Avoid drinking alcohol.  Avoid drinking large amounts of liquids with your meals.  Avoid bending over or lying down until 2-3 hours after eating. What foods are not recommended? These are some foods and drinks that may make your symptoms worse: Vegetables Tomatoes. Tomato juice. Tomato and spaghetti sauce. Chili peppers. Onion and garlic. Horseradish. Fruits Oranges, grapefruit, and lemon (fruit and juice). Meats High-fat meats, fish, and poultry. This includes hot dogs, ribs, ham, sausage, salami, and bacon. Dairy Whole milk and chocolate milk. Sour cream. Cream. Butter. Ice cream. Cream cheese. Drinks Coffee and tea. Bubbly (carbonated) drinks or energy drinks. Condiments Hot sauce. Barbecue sauce. Sweets/Desserts Chocolate and cocoa. Donuts. Peppermint and spearmint. Fats and Oils High-fat foods. This includes [place] fries and potato chips. Other Vinegar. Strong spices. This includes black pepper, white pepper, red pepper, cayenne, curry powder, cloves, ginger, and chili powder. The items listed above may not be a complete list of foods and drinks to avoid. Contact your dietitian for more information. This  information is not intended to replace advice given to you by your health care provider. Make sure you discuss any questions you have with your health care provider. Document Released: 10/21/2011 Document Revised: 09/27/2015 Document Reviewed: 02/23/2013 Elsevier Interactive Patient Education  2017 [employer]  Xantac 150 mg twice daily for next weeks. Prilosec 20 mg daily as needed for break through symptoms.  Great to see you. I will call you with your lab results from today and you can view them online.

## 2018-02-09 NOTE — Assessment & Plan Note (Signed)
Deteriorated. Check H pylori, lipase today. Start Zantac 150 mg twice daily for next two week, prilosec 20 mg daily as needed for breakthrough symptoms. Also given a hand out on GERD friendly diet. Call or return to clinic prn if these symptoms worsen or fail to improve as anticipated. The patient indicates understanding of these issues and agrees with the plan.

## 2018-02-10 ENCOUNTER — Encounter: Payer: Self-pay | Admitting: Family Medicine

## 2018-03-24 ENCOUNTER — Encounter: Payer: Self-pay | Admitting: Family Medicine

## 2018-03-29 ENCOUNTER — Ambulatory Visit (INDEPENDENT_AMBULATORY_CARE_PROVIDER_SITE_OTHER): Payer: BLUE CROSS/BLUE SHIELD

## 2018-03-29 ENCOUNTER — Encounter: Payer: Self-pay | Admitting: Family Medicine

## 2018-03-29 ENCOUNTER — Ambulatory Visit: Payer: BLUE CROSS/BLUE SHIELD | Admitting: Family Medicine

## 2018-03-29 VITALS — BP 108/67 | HR 79 | Temp 97.9°F | Ht 68.0 in | Wt 127.0 lb

## 2018-03-29 DIAGNOSIS — M5489 Other dorsalgia: Secondary | ICD-10-CM

## 2018-03-29 DIAGNOSIS — R1013 Epigastric pain: Secondary | ICD-10-CM

## 2018-03-29 MED ORDER — CYCLOBENZAPRINE HCL 10 MG PO TABS: 10.0000 mg | ORAL_TABLET | Freq: Three times a day (TID) | ORAL | 0 refills | Status: DC | PRN

## 2018-03-29 NOTE — Assessment & Plan Note (Signed)
>  25 minutes spent in face to face time with patient, >50% spent in counselling or coordination of care She admittedly tends to worry about her health and wants to make sure her lipase is normal again.  This is likely MSK strain from working over the computer and I advised changing angle or height of her computer, working on posture.  CXR to rule out referred pain from diaphragm. Labs as well. eRx sent for flexril as she has used this in past and it has helped with muscle spasms.  Discussed sedation precautions. Call or return to clinic prn if these symptoms worsen or fail to improve as anticipated. The patient indicates understanding of these issues and agrees with the plan.

## 2018-03-29 NOTE — Progress Notes (Signed)
Subjective:   Patient ID: Abigail Choi, female    DOB: 07/21/1970, 47 y.o.   MRN: [id number]  Abigail Choi is a pleasant 47 y.o. year old female who presents to clinic today with Back Pain (Described as a burning pain. Has been increasing for the past month-located in between in her shoulder blades and radiates to her lateral aspect. She has not taken anything for her symptoms. Her reflux has improved. )  on 03/29/2018  HPI:  Epigastric pain- I last saw her for this on 02/09/18.  Note reviewed.  ?GERD- has symptoms of indigestion/CP for almost 4 years.  In 04/2015, she was cleared from cardiology-  Dr. Juliann Paresallwood.  She tried Nexium years ago.  The night before she saw me (02/08/18), she developed chest pain and burning in her back.  Not worsened or relieved by rest.    She felt pain was similar to what she felt when she saw cardiology.No diaphoresis or nausea.  She had been under more stress lately- went back to work after 9 years.  Also ate chick fil a late last night before bed.  EKG on 02/09/18 was reassuring. Labs were neg and reassuring as well- including Lipase, H pylori, CMET, CBC and Troponin I.  Advised Zantac 150 mg twice daily with prilosec 20 mg daily as needed for two weeks and discussed GERD friendly diet. She is not longer taking anything for this.  She is here today because now she has burning in her back- interscapular pain.  Her reflux symptoms have improved.  For the past month, burning pain in between her shoulder blades that radiates.  Has not taken anything for it.  She has notices it is worse on work days- she works on a [employer] all day. Pain sometimes radiates up to her shoulder.   Current Outpatient Medications on File Prior to Visit  Medication Sig Dispense Refill  . levonorgestrel (MIRENA, 52 MG,) 20 MCG/24HR IUD 1 each by Intrauterine route once.    Marland Kitchen. omeprazole (PRILOSEC) 20 MG capsule 1 tablet by mouth as needed for breakthrough reflux symptoms.  (Patient not taking: Reported on 03/29/2018) 30 capsule 3  . ranitidine (ZANTAC) 150 MG tablet Take 1 tablet (150 mg total) by mouth 2 (two) times daily. (Patient not taking: Reported on 03/29/2018) 60 tablet 3   No current facility-administered medications on file prior to visit.     No Known Allergies  Past Medical History:  Diagnosis Date  . Frequent headaches   . RUQ abdominal pain     No past surgical history on file.  Family History  Problem Relation Age of Onset  . Hypertension Father   . Heart disease Maternal Grandfather   . Cancer Paternal Grandmother   . Hypertension Paternal Grandfather   . Diabetes Paternal Grandfather     Social History   Socioeconomic History  . Marital status: Married    Spouse name: Not on file  . Number of children: Not on file  . Years of education: Not on file  . Highest education level: Not on file  Occupational History  . Not on file  Social Needs  . Financial resource strain: Not on file  . Food insecurity:    Worry: Not on file    Inability: Not on file  . Transportation needs:    Medical: Not on file    Non-medical: Not on file  Tobacco Use  . Smoking status: Never Smoker  . Smokeless tobacco: Never Used  Substance  and Sexual Activity  . Alcohol use: No  . Drug use: No  . Sexual activity: Yes    Birth control/protection: IUD  Lifestyle  . Physical activity:    Days per week: Not on file    Minutes per session: Not on file  . Stress: Not on file  Relationships  . Social connections:    Talks on phone: Not on file    Gets together: Not on file    Attends religious service: Not on file    Active member of club or organization: Not on file    Attends meetings of clubs or organizations: Not on file    Relationship status: Not on file  . Intimate partner violence:    Fear of current or ex partner: Not on file    Emotionally abused: Not on file    Physically abused: Not on file    Forced sexual activity: Not on  file  Other Topics Concern  . Not on file  Social History Narrative  . Not on file   The PMH, PSH, Social History, Family History, Medications, and allergies have been reviewed in [facility], and have been updated if relevant.  Review of Systems  Constitutional: Negative.   HENT: Negative.   Respiratory: Negative.   Cardiovascular: Negative.   Gastrointestinal: Negative.   Endocrine: Negative.   Genitourinary: Negative.   Musculoskeletal: Positive for back pain.  Neurological: Negative.   Hematological: Negative.   Psychiatric/Behavioral: Negative.   All other systems reviewed and are negative.      Objective:    BP 108/67   Pulse 79   Temp 97.9 F (36.6 C) (Oral)   Ht 5\' 8"  (1.727 m)   Wt 127 lb (57.6 kg)   SpO2 97%   BMI 19.31 kg/m    Physical Exam  Constitutional: She is oriented to person, place, and time. She appears well-developed and well-nourished. No distress.  HENT:  Head: Normocephalic and atraumatic.  Eyes: EOM are normal.  Neck: Normal range of motion.  Cardiovascular: Normal rate and regular rhythm.  Pulmonary/Chest: Effort normal and breath sounds normal.  Abdominal: Soft. Bowel sounds are normal. There is no tenderness.  Musculoskeletal:       Thoracic back: She exhibits pain and spasm. She exhibits no tenderness, no bony tenderness, no swelling, no edema, no deformity, no laceration and normal pulse.  Neurological: She is alert and oriented to person, place, and time. No cranial nerve deficit.  Skin: Skin is warm and dry. She is not diaphoretic.  Psychiatric: She has a normal mood and affect. Her behavior is normal. Judgment and thought content normal.  Nursing note and vitals reviewed.         Assessment & Plan:   Epigastric pain - Plan: Sedimentation rate, Antinuclear Antib (ANA), Lipase  Interscapular pain - Plan: DG Chest 2 View, Comprehensive metabolic panel, DG Chest 2 View No follow-ups on file.

## 2018-03-29 NOTE — Patient Instructions (Signed)
Great to see you. I will call you with your lab results from today and you can view them online.   

## 2018-03-30 DIAGNOSIS — J984 Other disorders of lung: Secondary | ICD-10-CM | POA: Diagnosis not present

## 2018-03-30 LAB — SEDIMENTATION RATE: Sed Rate: 15 mm/hr (ref 0–20)

## 2018-03-30 LAB — COMPREHENSIVE METABOLIC PANEL
ALT: 11 U/L (ref 0–35)
AST: 15 U/L (ref 0–37)
Albumin: 4.7 g/dL (ref 3.5–5.2)
Alkaline Phosphatase: 61 U/L (ref 39–117)
BUN: 16 mg/dL (ref 6–23)
CO2: 28 mEq/L (ref 19–32)
Calcium: 9.7 mg/dL (ref 8.4–10.5)
Chloride: 102 mEq/L (ref 96–112)
Creatinine, Ser: 0.88 mg/dL (ref 0.40–1.20)
GFR: 73.34 mL/min (ref >60.00)
Glucose, Bld: 94 mg/dL (ref 70–99)
Potassium: 3.9 mEq/L (ref 3.5–5.1)
Sodium: 139 mEq/L (ref 135–145)
Total Bilirubin: 0.3 mg/dL (ref 0.2–1.2)
Total Protein: 7.3 g/dL (ref 6.0–8.3)

## 2018-03-30 LAB — LIPASE: Lipase: 28 U/L (ref 11.0–59.0)

## 2018-03-31 LAB — ANTI-NUCLEAR AB-TITER (ANA TITER)
ANA TITER: 1:160 {titer} — ABNORMAL HIGH
ANA Titer 1: 1:80 {titer} — ABNORMAL HIGH

## 2018-03-31 LAB — ANA: Anti Nuclear Antibody(ANA): POSITIVE — AB

## 2018-04-02 ENCOUNTER — Other Ambulatory Visit: Payer: Self-pay | Admitting: Family Medicine

## 2018-04-02 DIAGNOSIS — R768 Other specified abnormal immunological findings in serum: Secondary | ICD-10-CM

## 2018-04-08 ENCOUNTER — Encounter: Payer: Self-pay | Admitting: Family Medicine

## 2018-05-13 NOTE — Progress Notes (Signed)
Office Visit Note  Patient: Abigail Choi             Date of Birth: [birth year]           MRN: [id number]             PCP: Lucille Passy, MD Referring: Lucille Passy, MD Visit Date: 05/25/2018 Occupation: Recruiter for [employer]  Subjective:  Positive ANA and generalized pain.   History of Present Illness: Abigail Choi is a 48 y.o. female seen in consultation per request of her PCP.  According to patient about 3 years ago she started having some thoracic pain which was radiating to her left arm.  She was seen in the emergency room where the work-up was negative but she was referred to cardiologist.  She states she had a stress test at the cardiologist which was negative and EKG x2 since then which were negative.  She also was diagnosed with some reflux symptoms which are better now.  She states she has had generalized pain off and on for the last 3 years.  In the last 3 months she has been experiencing flulike symptoms where she has generalized pain achiness and skin sensitivity.  The symptoms comes and go.  She states mostly the symptoms are in her upper back and sometimes in her hip area.  Currently she is not having much pain.  She has some dull pain in the thoracic region.  She states he sleeps fairly well.  She has had few stressors but not significant stress in her life.  Activities of Daily Living:  Patient reports morning stiffness for 0 minute.   Patient Denies nocturnal pain.  Difficulty dressing/grooming: Denies Difficulty climbing stairs: Denies Difficulty getting out of chair: Denies Difficulty using hands for taps, buttons, cutlery, and/or writing: Denies  Review of Systems  Constitutional: Positive for fatigue. Negative for night sweats, weight gain and weight loss.  HENT: Negative for mouth sores, trouble swallowing, trouble swallowing, mouth dryness and nose dryness.   Eyes: Negative for pain, redness, visual disturbance and dryness.  Respiratory: Negative for  cough, shortness of breath and difficulty breathing.   Cardiovascular: Positive for palpitations. Negative for chest pain, hypertension, irregular heartbeat and swelling in legs/feet.  Gastrointestinal: Negative for blood in stool, constipation and diarrhea.       Loose stools  Endocrine: Negative for increased urination.  Genitourinary: Negative for vaginal dryness.  Musculoskeletal: Positive for arthralgias, joint pain, myalgias and myalgias. Negative for joint swelling, muscle weakness, morning stiffness and muscle tenderness.  Skin: Negative for color change, rash, hair loss, skin tightness, ulcers and sensitivity to sunlight.  Allergic/Immunologic: Negative for susceptible to infections.  Neurological: Negative for dizziness, memory loss, night sweats and weakness.  Hematological: Negative for swollen glands.  Psychiatric/Behavioral: Positive for sleep disturbance. Negative for depressed mood. The patient is nervous/anxious.     PMFS History:  Patient Active Problem List   Diagnosis Date Noted  . Interscapular pain 03/29/2018  . Chest pain 02/09/2018  . GERD (gastroesophageal reflux disease) 02/09/2018  . Ruptured varicose vein 05/27/2017  . Epigastric pain 01/27/2017  . LGSIL (low grade squamous intraepithelial dysplasia) 05/03/2015  . Chronic headache disorder 10/13/2013    Past Medical History:  Diagnosis Date  . Frequent headaches   . RUQ abdominal pain     Family History  Problem Relation Age of Onset  . Hypertension Father   . Heart disease Maternal Grandfather   . Cancer Paternal Grandmother   .  Hypertension Paternal Grandfather   . Diabetes Paternal Grandfather   . Healthy Daughter   . Healthy Daughter   . Healthy Daughter    History reviewed. No pertinent surgical history. Social History   Social History Narrative  . Not on file   Immunization History  Administered Date(s) Administered  . Influenza,inj,Quad PF,6+ Mos 03/27/2014, 04/18/2015  .  Influenza-Unspecified 03/05/2017     Objective: Vital Signs: BP 110/72 (BP Location: Right Arm, Patient Position: Sitting, Cuff Size: Normal)   Pulse 83   Resp 12   Ht _0  (1.702 m)   Wt 133 lb 3.2 oz (60.4 kg)   BMI 20.86 kg/m    Physical Exam Vitals signs and nursing note reviewed.  Constitutional:      Appearance: She is well-developed.  HENT:     Head: Normocephalic and atraumatic.  Eyes:     Conjunctiva/sclera: Conjunctivae normal.  Neck:     Musculoskeletal: Normal range of motion.  Cardiovascular:     Rate and Rhythm: Normal rate and regular rhythm.     Heart sounds: Normal heart sounds.  Pulmonary:     Effort: Pulmonary effort is normal.     Breath sounds: Normal breath sounds.  Abdominal:     General: Bowel sounds are normal.     Palpations: Abdomen is soft.  Lymphadenopathy:     Cervical: No cervical adenopathy.  Skin:    General: Skin is warm and dry.     Capillary Refill: Capillary refill takes less than 2 seconds.  Neurological:     Mental Status: She is alert and oriented to person, place, and time.  Psychiatric:        Behavior: Behavior normal.      Musculoskeletal Exam: C-spine thoracic lumbar spine good range of motion.  No SI joint tenderness was noted.  She has some tenderness over mid thoracic region.  Shoulder joints elbow joints wrist joints MCPs PIPs DIPs been good range of motion with no synovitis.  Hip joints knee joints ankles MTPs PIPs been good range of motion with no synovitis.  She has some tenderness over bilateral trapezius area and some tenderness over the gluteal region.  CDAI Exam: CDAI Score: Not documented Patient Global Assessment: Not documented; Provider Global Assessment: Not documented Swollen: Not documented; Tender: Not documented Joint Exam   Not documented   There is currently no information documented on the homunculus. Go to the Rheumatology activity and complete the homunculus joint  exam.  Investigation: Findings:  03/29/18: ANA 1:80 NH, 1:160 NS, Sed rate 15  Component     Latest Ref Rng & Units 03/29/2018  Sodium     135 - 145 mEq/L 139  Potassium     3.5 - 5.1 mEq/L 3.9  Chloride     96 - 112 mEq/L 102  CO2     19 - 32 mEq/L 28  Glucose     70 - 99 mg/dL 94  BUN     6 - 23 mg/dL 16  Creatinine     0.40 - 1.20 mg/dL 0.88  Total Bilirubin     0.2 - 1.2 mg/dL 0.3  Alkaline Phosphatase     39 - 117 U/L 61  AST     0 - 37 U/L 15  ALT     0 - 35 U/L 11  Total Protein     6.0 - 8.3 g/dL 7.3  Albumin     3.5 - 5.2 g/dL 4.7  Calcium  8.4 - 10.5 mg/dL 9.7  GFR     >60.00 mL/min 73.34  ANA Titer 1     titer 1:80 (H)  ANA Pattern 1      Nuclear, Homogeneous (A)  ANA TITER     titer 1:160 (H)  ANA PATTERN      Nuclear, Speckled (A)  Sed Rate     0 - 20 mm/hr 15  Anti Nuclear Antibody(ANA)     NEGATIVE POSITIVE (A)  Lipase     11.0 - 59.0 U/L 28.0   Imaging: Xr Thoracic Spine 2 View  Result Date: 05/25/2018 No disc space narrowing was noted.  No syndesmophytes were noted. Impression: Unremarkable x-ray of the thoracic spine.   Recent Labs: Lab Results  Component Value Date   WBC 4.0 02/09/2018   HGB 13.0 02/09/2018   PLT 175.0 02/09/2018   NA 139 03/29/2018   K 3.9 03/29/2018   CL 102 03/29/2018   CO2 28 03/29/2018   GLUCOSE 94 03/29/2018   BUN 16 03/29/2018   CREATININE 0.88 03/29/2018   BILITOT 0.3 03/29/2018   ALKPHOS 61 03/29/2018   AST 15 03/29/2018   ALT 11 03/29/2018   PROT 7.3 03/29/2018   ALBUMIN 4.7 03/29/2018   CALCIUM 9.7 03/29/2018   GFRAA >60 04/10/2015    Speciality Comments: No specialty comments available.  Procedures:  No procedures performed Allergies: Patient has no known allergies.   Assessment / Plan:     Visit Diagnoses: Positive ANA (antinuclear antibody) - 03/29/18: ANA 1:80 NH, 1:160 NS, Sed rate 15.  Patient has low titer positive ANA.  She continues to have some generalized pain and  myalgias but had no synovitis on examination.  There is no family history of autoimmune disease.  I will obtain AVISE labs today.  Chronic midline thoracic back pain -she had some point tenderness in the midthoracic region.  Plan: XR Thoracic Spine 2 View, HLA-B27 antigen.  The x-ray of the thoracic spine was unremarkable.  Myofascial pain-she complains of generalized pain and hyperalgesia.  She had few tender points.  Need for regular exercise, good sleep hygiene was discussed.  I will also refer her to physical therapy.  Other fatigue -she has been experiencing increased fatigue.  I will obtain following labs.  Plan: CK, TSH, VITAMIN D 25 Hydroxy (Vit-D Deficiency, Fractures), Serum protein electrophoresis with reflex, Glucose 6 phosphate dehydrogenase  Primary insomnia-good sleep hygiene was discussed.  History of gastroesophageal reflux (GERD)-doing better now with PPIs.  Headache disorder -she is currently not having any headaches.  Orders: Orders Placed This Encounter  Procedures  . XR Thoracic Spine 2 View  . CK  . TSH  . VITAMIN D 25 Hydroxy (Vit-D Deficiency, Fractures)  . HLA-B27 antigen  . Serum protein electrophoresis with reflex  . Glucose 6 phosphate dehydrogenase   No orders of the defined types were placed in this encounter.   Face-to-face time spent with patient was 45 minutes. Greater than 50% of time was spent in counseling and coordination of care.  Follow-Up Instructions: Return for +ANA, Pain.   Bo Merino, MD  Note - This record has been created using Editor, commissioning.  Chart creation errors have been sought, but may not always  have been located. Such creation errors do not reflect on  the standard of medical care.

## 2018-05-25 ENCOUNTER — Ambulatory Visit: Payer: BLUE CROSS/BLUE SHIELD | Admitting: Rheumatology

## 2018-05-25 ENCOUNTER — Ambulatory Visit (INDEPENDENT_AMBULATORY_CARE_PROVIDER_SITE_OTHER): Payer: BLUE CROSS/BLUE SHIELD

## 2018-05-25 ENCOUNTER — Encounter: Payer: Self-pay | Admitting: Rheumatology

## 2018-05-25 VITALS — BP 110/72 | HR 83 | Resp 12 | Ht 67.0 in | Wt 133.2 lb

## 2018-05-25 DIAGNOSIS — R768 Other specified abnormal immunological findings in serum: Secondary | ICD-10-CM | POA: Diagnosis not present

## 2018-05-25 DIAGNOSIS — R5383 Other fatigue: Secondary | ICD-10-CM

## 2018-05-25 DIAGNOSIS — M7918 Myalgia, other site: Secondary | ICD-10-CM

## 2018-05-25 DIAGNOSIS — G8929 Other chronic pain: Secondary | ICD-10-CM | POA: Diagnosis not present

## 2018-05-25 DIAGNOSIS — R7689 Other specified abnormal immunological findings in serum: Secondary | ICD-10-CM

## 2018-05-25 DIAGNOSIS — M546 Pain in thoracic spine: Secondary | ICD-10-CM

## 2018-05-25 DIAGNOSIS — Z8719 Personal history of other diseases of the digestive system: Secondary | ICD-10-CM

## 2018-05-25 DIAGNOSIS — R519 Headache, unspecified: Secondary | ICD-10-CM

## 2018-05-25 DIAGNOSIS — R51 Headache: Secondary | ICD-10-CM

## 2018-05-25 DIAGNOSIS — E559 Vitamin D deficiency, unspecified: Secondary | ICD-10-CM | POA: Diagnosis not present

## 2018-05-25 DIAGNOSIS — F5101 Primary insomnia: Secondary | ICD-10-CM

## 2018-05-25 NOTE — Patient Instructions (Signed)

## 2018-05-26 ENCOUNTER — Telehealth: Payer: Self-pay | Admitting: Rheumatology

## 2018-05-26 NOTE — Telephone Encounter (Signed)
Attempted to contact the patient and left message for patient to call the office.  

## 2018-05-26 NOTE — Telephone Encounter (Signed)
Patient called stating she was returning your call.   °

## 2018-05-27 ENCOUNTER — Telehealth: Payer: Self-pay | Admitting: Rheumatology

## 2018-05-27 ENCOUNTER — Telehealth: Payer: Self-pay | Admitting: *Deleted

## 2018-05-27 DIAGNOSIS — E559 Vitamin D deficiency, unspecified: Secondary | ICD-10-CM

## 2018-05-27 LAB — PROTEIN ELECTROPHORESIS, SERUM, WITH REFLEX
Albumin ELP: 4.5 g/dL (ref 3.8–4.8)
Alpha 1: 0.3 g/dL (ref 0.2–0.3)
Alpha 2: 0.5 g/dL (ref 0.5–0.9)
Beta 2: 0.2 g/dL (ref 0.2–0.5)
Beta Globulin: 0.3 g/dL — ABNORMAL LOW (ref 0.4–0.6)
Gamma Globulin: 1.3 g/dL (ref 0.8–1.7)
Total Protein: 7.2 g/dL (ref 6.1–8.1)

## 2018-05-27 LAB — GLUCOSE 6 PHOSPHATE DEHYDROGENASE: G-6PDH: 15.2 U/g Hgb (ref 7.0–20.5)

## 2018-05-27 LAB — TSH: TSH: 1.6 mIU/L

## 2018-05-27 LAB — VITAMIN D 25 HYDROXY (VIT D DEFICIENCY, FRACTURES): Vit D, 25-Hydroxy: 27 ng/mL — ABNORMAL LOW (ref 30–100)

## 2018-05-27 LAB — HLA-B27 ANTIGEN: HLA-B27 Antigen: NEGATIVE

## 2018-05-27 LAB — CK: Total CK: 114 U/L (ref 29–143)

## 2018-05-27 MED ORDER — VITAMIN D (ERGOCALCIFEROL) 1.25 MG (50000 UNIT) PO CAPS: 50000.0000 [IU] | ORAL_CAPSULE | ORAL | 0 refills | Status: DC

## 2018-05-27 NOTE — Telephone Encounter (Signed)
Attempted to contact patient and left message for patient to call the office.  

## 2018-05-27 NOTE — Telephone Encounter (Signed)
-----   Message from Gearldine Bienenstock, PA-C sent at 05/26/2018  8:13 AM EST ----- Vitamin D is low.  Please notify patient and send in vitamin D 50,000 units by mouth once a week for 3 months.  We will recheck vitamin D in 3 months.  CK WNL.  TSH WNL.

## 2018-05-27 NOTE — Telephone Encounter (Signed)
Patient left a voicemail stating she was returning your call.   

## 2018-06-08 DIAGNOSIS — F5101 Primary insomnia: Secondary | ICD-10-CM | POA: Insufficient documentation

## 2018-06-08 DIAGNOSIS — M7918 Myalgia, other site: Secondary | ICD-10-CM | POA: Insufficient documentation

## 2018-06-08 NOTE — Progress Notes (Signed)
Office Visit Note  Patient: Abigail Choi             Date of Birth: 27-Mar-1971           MRN: [id number]             PCP: Lucille Passy, MD Referring: Lucille Passy, MD Visit Date: 06/22/2018 Occupation: '@[employer]' @  Subjective:  Generalized pain.   History of Present Illness: Abigail Choi is a 48 y.o. female with history of myofascial pain and positive ANA.  She states she has been having intermittent thoracic pain and generalized discomfort.  She states she has pain off and on.  It does not last for very long.  She continues to have some insomnia and fatigue.  She started taking vitamin D supplement after vitamin D deficiency.  She has no history of joint swelling, oral ulcers, nasal ulcers, sicca symptoms, Raynaud's phenomenon, photosensitivity.  Activities of Daily Living:  Patient reports morning stiffness for 0 none.   Patient Denies nocturnal pain.  Difficulty dressing/grooming: Denies Difficulty climbing stairs: Denies Difficulty getting out of chair: Denies Difficulty using hands for taps, buttons, cutlery, and/or writing: Denies  Review of Systems  Constitutional: Positive for fatigue. Negative for night sweats, weight gain and weight loss.  HENT: Negative for mouth sores, trouble swallowing, trouble swallowing, mouth dryness and nose dryness.   Eyes: Negative for pain, redness, visual disturbance and dryness.  Respiratory: Negative for cough, shortness of breath and difficulty breathing.   Cardiovascular: Negative for chest pain, palpitations, hypertension, irregular heartbeat and swelling in legs/feet.  Gastrointestinal: Negative for blood in stool, constipation and diarrhea.  Endocrine: Negative for excessive thirst and increased urination.  Genitourinary: Negative for difficulty urinating and vaginal dryness.  Musculoskeletal: Positive for muscle tenderness. Negative for arthralgias, joint pain, joint swelling, myalgias, muscle weakness, morning stiffness and  myalgias.  Skin: Negative for color change, rash, hair loss, skin tightness, ulcers and sensitivity to sunlight.  Allergic/Immunologic: Negative for susceptible to infections.  Neurological: Negative for dizziness, memory loss, night sweats and weakness.  Hematological: Negative for bruising/bleeding tendency and swollen glands.  Psychiatric/Behavioral: Negative for depressed mood and sleep disturbance. The patient is not nervous/anxious.     PMFS History:  Patient Active Problem List   Diagnosis Date Noted  . Myofascial pain 06/08/2018  . Primary insomnia 06/08/2018  . Interscapular pain 03/29/2018  . Chest pain 02/09/2018  . GERD (gastroesophageal reflux disease) 02/09/2018  . Ruptured varicose vein 05/27/2017  . Epigastric pain 01/27/2017  . LGSIL (low grade squamous intraepithelial dysplasia) 05/03/2015  . Chronic headache disorder 10/13/2013    Past Medical History:  Diagnosis Date  . Frequent headaches   . RUQ abdominal pain     Family History  Problem Relation Age of Onset  . Hypertension Father   . Heart disease Maternal Grandfather   . Cancer Paternal Grandmother   . Hypertension Paternal Grandfather   . Diabetes Paternal Grandfather   . Healthy Daughter   . Healthy Daughter   . Healthy Daughter    History reviewed. No pertinent surgical history. Social History   Social History Narrative  . Not on file   Immunization History  Administered Date(s) Administered  . Influenza,inj,Quad PF,6+ Mos 03/27/2014, 04/18/2015  . Influenza-Unspecified 03/05/2017     Objective: Vital Signs: BP (!) 100/55 (BP Location: Left Arm, Patient Position: Sitting, Cuff Size: Normal)   Pulse 71   Resp 14   Ht 5' 7.5" (1.715 m)   Wt 131  lb 12.8 oz (59.8 kg)   BMI 20.34 kg/m    Physical Exam Vitals signs and nursing note reviewed.  Constitutional:      Appearance: She is well-developed.  HENT:     Head: Normocephalic and atraumatic.  Eyes:     Conjunctiva/sclera:  Conjunctivae normal.  Neck:     Musculoskeletal: Normal range of motion.  Cardiovascular:     Rate and Rhythm: Normal rate and regular rhythm.     Heart sounds: Normal heart sounds.  Pulmonary:     Effort: Pulmonary effort is normal.     Breath sounds: Normal breath sounds.  Abdominal:     General: Bowel sounds are normal.     Palpations: Abdomen is soft.  Lymphadenopathy:     Cervical: No cervical adenopathy.  Skin:    General: Skin is warm and dry.     Capillary Refill: Capillary refill takes less than 2 seconds.  Neurological:     Mental Status: She is alert and oriented to person, place, and time.  Psychiatric:        Behavior: Behavior normal.      Musculoskeletal Exam: C-spine thoracic lumbar spine good range of motion.  Shoulder joints elbow joints wrist joint MCPs PIPs DIPs with good range of motion with no synovitis.  Hip joints knee joints ankles MTPs PIPs in good range of motion with no synovitis.  CDAI Exam: CDAI Score: Not documented Patient Global Assessment: Not documented; Provider Global Assessment: Not documented Swollen: Not documented; Tender: Not documented Joint Exam   Not documented   There is currently no information documented on the homunculus. Go to the Rheumatology activity and complete the homunculus joint exam.  Investigation: No additional findings.  Imaging: Xr Thoracic Spine 2 View  Result Date: 05/25/2018 No disc space narrowing was noted.  No syndesmophytes were noted. Impression: Unremarkable x-ray of the thoracic spine.   Recent Labs: Lab Results  Component Value Date   WBC 4.0 02/09/2018   HGB 13.0 02/09/2018   PLT 175.0 02/09/2018   NA 139 03/29/2018   K 3.9 03/29/2018   CL 102 03/29/2018   CO2 28 03/29/2018   GLUCOSE 94 03/29/2018   BUN 16 03/29/2018   CREATININE 0.88 03/29/2018   BILITOT 0.3 03/29/2018   ALKPHOS 61 03/29/2018   AST 15 03/29/2018   ALT 11 03/29/2018   PROT 7.2 05/25/2018   ALBUMIN 4.7 03/29/2018    CALCIUM 9.7 03/29/2018   GFRAA >60 04/10/2015  SPEP negative, CK 114, TSH normal, HLA-B27 negative, G6PD normal, vitamin D 27 May 25, 2018 AVISE lupus index -0.5, ANA 1: 640 speckled ENA negative, CB CAP negative, anticardiolipin negative, beta-2 GP 1-, RF negative, anti-CCP negative, antithyroglobulin positive, antithyroid peroxidase negative  Speciality Comments: No specialty comments available.  Procedures:  No procedures performed Allergies: Patient has no known allergies.   Assessment / Plan:     Visit Diagnoses: Positive ANA (antinuclear antibody) - ANA 1: 640 speckled, ENA negative, CB CAP negative.  Lab findings were discussed at length.  Antithyroglobulin positive.  Patient has no clinical features of autoimmune disease on examination today.  I have advised her to use sunscreen.  If she develops any new symptoms she is supposed to notify me.  Myofascial pain - Thoracic pain, x-ray of thoracic spine was unremarkable.  Some stretching exercises were demonstrated in the office today.  A handout was given.  I also advised her to schedule appointment with physical therapy.  Need for water aerobics and regular  exercise was discussed at length.  Other fatigue-related to insomnia.  Primary insomnia-good sleep hygiene was discussed.  Vitamin D deficiency-patient was placed on vitamin D.  We will check vitamin D level in 3 months.  History of headache-improved per patient.  History of gastroesophageal reflux (GERD)   Orders: No orders of the defined types were placed in this encounter.  No orders of the defined types were placed in this encounter.     Follow-Up Instructions: Return in about 6 months (around 12/21/2018) for MFPS, +ANA.   Bo Merino, MD  Note - This record has been created using Editor, commissioning.  Chart creation errors have been sought, but may not always  have been located. Such creation errors do not reflect on  the standard of medical care.

## 2018-06-22 ENCOUNTER — Encounter: Payer: Self-pay | Admitting: Rheumatology

## 2018-06-22 ENCOUNTER — Ambulatory Visit: Payer: BLUE CROSS/BLUE SHIELD | Admitting: Rheumatology

## 2018-06-22 VITALS — BP 100/55 | HR 71 | Resp 14 | Ht 67.5 in | Wt 131.8 lb

## 2018-06-22 DIAGNOSIS — F5101 Primary insomnia: Secondary | ICD-10-CM

## 2018-06-22 DIAGNOSIS — M7918 Myalgia, other site: Secondary | ICD-10-CM

## 2018-06-22 DIAGNOSIS — Z8719 Personal history of other diseases of the digestive system: Secondary | ICD-10-CM

## 2018-06-22 DIAGNOSIS — R5383 Other fatigue: Secondary | ICD-10-CM | POA: Diagnosis not present

## 2018-06-22 DIAGNOSIS — R768 Other specified abnormal immunological findings in serum: Secondary | ICD-10-CM | POA: Diagnosis not present

## 2018-06-22 DIAGNOSIS — E559 Vitamin D deficiency, unspecified: Secondary | ICD-10-CM

## 2018-06-22 DIAGNOSIS — Z87898 Personal history of other specified conditions: Secondary | ICD-10-CM

## 2018-08-12 ENCOUNTER — Other Ambulatory Visit: Payer: Self-pay | Admitting: Rheumatology

## 2018-08-12 NOTE — Telephone Encounter (Signed)
Patient will need Vitamin D recheck Vitamin D level prior to refill.

## 2018-12-07 DIAGNOSIS — Z01419 Encounter for gynecological examination (general) (routine) without abnormal findings: Secondary | ICD-10-CM | POA: Diagnosis not present

## 2018-12-07 DIAGNOSIS — Z124 Encounter for screening for malignant neoplasm of cervix: Secondary | ICD-10-CM | POA: Diagnosis not present

## 2018-12-07 DIAGNOSIS — Z6821 Body mass index (BMI) 21.0-21.9, adult: Secondary | ICD-10-CM | POA: Diagnosis not present

## 2018-12-14 NOTE — Progress Notes (Deleted)
Office Visit Note  Patient: Abigail Choi             Date of Birth: 07-03-1970           MRN: [id number]             PCP: Lucille Passy, MD Referring: Lucille Passy, MD Visit Date: 12/28/2018 Occupation: @GUAROCC @  Subjective:  No chief complaint on file.   History of Present Illness: Abigail Choi is a 48 y.o. female ***   Activities of Daily Living:  Patient reports morning stiffness for *** {minute/hour:19697}.   Patient {ACTIONS;DENIES/REPORTS:21021675::"Denies"} nocturnal pain.  Difficulty dressing/grooming: {ACTIONS;DENIES/REPORTS:21021675::"Denies"} Difficulty climbing stairs: {ACTIONS;DENIES/REPORTS:21021675::"Denies"} Difficulty getting out of chair: {ACTIONS;DENIES/REPORTS:21021675::"Denies"} Difficulty using hands for taps, buttons, cutlery, and/or writing: {ACTIONS;DENIES/REPORTS:21021675::"Denies"}  No Rheumatology ROS completed.   PMFS History:  Patient Active Problem List   Diagnosis Date Noted  . Myofascial pain 06/08/2018  . Primary insomnia 06/08/2018  . Interscapular pain 03/29/2018  . Chest pain 02/09/2018  . GERD (gastroesophageal reflux disease) 02/09/2018  . Ruptured varicose vein 05/27/2017  . Epigastric pain 01/27/2017  . LGSIL (low grade squamous intraepithelial dysplasia) 05/03/2015  . Chronic headache disorder 10/13/2013    Past Medical History:  Diagnosis Date  . Frequent headaches   . RUQ abdominal pain     Family History  Problem Relation Age of Onset  . Hypertension Father   . Heart disease Maternal Grandfather   . Cancer Paternal Grandmother   . Hypertension Paternal Grandfather   . Diabetes Paternal Grandfather   . Healthy Daughter   . Healthy Daughter   . Healthy Daughter    No past surgical history on file. Social History   Social History Narrative  . Not on file   Immunization History  Administered Date(s) Administered  . Influenza,inj,Quad PF,6+ Mos 03/27/2014, 04/18/2015  . Influenza-Unspecified 03/05/2017     Objective: Vital Signs: There were no vitals taken for this visit.   Physical Exam   Musculoskeletal Exam: ***  CDAI Exam: CDAI Score: - Patient Global: -; Provider Global: - Swollen: -; Tender: - Joint Exam   No joint exam has been documented for this visit   There is currently no information documented on the homunculus. Go to the Rheumatology activity and complete the homunculus joint exam.  Investigation: No additional findings.  Imaging: No results found.  Recent Labs: Lab Results  Component Value Date   WBC 4.0 02/09/2018   HGB 13.0 02/09/2018   PLT 175.0 02/09/2018   NA 139 03/29/2018   K 3.9 03/29/2018   CL 102 03/29/2018   CO2 28 03/29/2018   GLUCOSE 94 03/29/2018   BUN 16 03/29/2018   CREATININE 0.88 03/29/2018   BILITOT 0.3 03/29/2018   ALKPHOS 61 03/29/2018   AST 15 03/29/2018   ALT 11 03/29/2018   PROT 7.2 05/25/2018   ALBUMIN 4.7 03/29/2018   CALCIUM 9.7 03/29/2018   GFRAA >60 04/10/2015    Speciality Comments: No specialty comments available.  Procedures:  No procedures performed Allergies: Patient has no known allergies.   Assessment / Plan:     Visit Diagnoses: No diagnosis found.  Orders: No orders of the defined types were placed in this encounter.  No orders of the defined types were placed in this encounter.   Face-to-face time spent with patient was *** minutes. Greater than 50% of time was spent in counseling and coordination of care.  Follow-Up Instructions: No follow-ups on file.   Earnestine Mealing, CMA  Note -  This record has been created using [employer].  Chart creation errors have been sought, but may not always  have been located. Such creation errors do not reflect on  the standard of medical care.

## 2018-12-28 ENCOUNTER — Ambulatory Visit: Payer: Self-pay | Admitting: Rheumatology

## 2019-01-03 DIAGNOSIS — Z1231 Encounter for screening mammogram for malignant neoplasm of breast: Secondary | ICD-10-CM | POA: Diagnosis not present

## 2019-01-11 DIAGNOSIS — R87615 Unsatisfactory cytologic smear of cervix: Secondary | ICD-10-CM | POA: Diagnosis not present

## 2019-01-11 DIAGNOSIS — R87610 Atypical squamous cells of undetermined significance on cytologic smear of cervix (ASC-US): Secondary | ICD-10-CM | POA: Diagnosis not present

## 2019-04-18 DIAGNOSIS — Z20828 Contact with and (suspected) exposure to other viral communicable diseases: Secondary | ICD-10-CM | POA: Diagnosis not present

## 2019-06-27 NOTE — Progress Notes (Deleted)
Office Visit Note  Patient: Abigail Choi             Date of Birth: [birth year]           MRN: [id number]             PCP: Dianne Dun, MD Referring: Dianne Dun, MD Visit Date: 06/30/2019 Occupation: @GUAROCC @  Subjective:  No chief complaint on file.   History of Present Illness: Abigail Choi is a 49 y.o. female ***   Activities of Daily Living:  Patient reports morning stiffness for *** {minute/hour:19697}.   Patient {ACTIONS;DENIES/REPORTS:21021675::"Denies"} nocturnal pain.  Difficulty dressing/grooming: {ACTIONS;DENIES/REPORTS:21021675::"Denies"} Difficulty climbing stairs: {ACTIONS;DENIES/REPORTS:21021675::"Denies"} Difficulty getting out of chair: {ACTIONS;DENIES/REPORTS:21021675::"Denies"} Difficulty using hands for taps, buttons, cutlery, and/or writing: {ACTIONS;DENIES/REPORTS:21021675::"Denies"}  No Rheumatology ROS completed.   PMFS History:  Patient Active Problem List   Diagnosis Date Noted  . Myofascial pain 06/08/2018  . Primary insomnia 06/08/2018  . Interscapular pain 03/29/2018  . Chest pain 02/09/2018  . GERD (gastroesophageal reflux disease) 02/09/2018  . Ruptured varicose vein 05/27/2017  . Epigastric pain 01/27/2017  . LGSIL (low grade squamous intraepithelial dysplasia) 05/03/2015  . Chronic headache disorder 10/13/2013    Past Medical History:  Diagnosis Date  . Frequent headaches   . RUQ abdominal pain     Family History  Problem Relation Age of Onset  . Hypertension Father   . Heart disease Maternal Grandfather   . Cancer Paternal Grandmother   . Hypertension Paternal Grandfather   . Diabetes Paternal Grandfather   . Healthy Daughter   . Healthy Daughter   . Healthy Daughter    No past surgical history on file. Social History   Social History Narrative  . Not on file   Immunization History  Administered Date(s) Administered  . Influenza,inj,Quad PF,6+ Mos 03/27/2014, 04/18/2015  . Influenza-Unspecified 03/05/2017     Objective: Vital Signs: There were no vitals taken for this visit.   Physical Exam   Musculoskeletal Exam: ***  CDAI Exam: CDAI Score: -- Patient Global: --; Provider Global: -- Swollen: --; Tender: -- Joint Exam 06/30/2019   No joint exam has been documented for this visit   There is currently no information documented on the homunculus. Go to the Rheumatology activity and complete the homunculus joint exam.  Investigation: No additional findings.  Imaging: No results found.  Recent Labs: Lab Results  Component Value Date   WBC 4.0 02/09/2018   HGB 13.0 02/09/2018   PLT 175.0 02/09/2018   NA 139 03/29/2018   K 3.9 03/29/2018   CL 102 03/29/2018   CO2 28 03/29/2018   GLUCOSE 94 03/29/2018   BUN 16 03/29/2018   CREATININE 0.88 03/29/2018   BILITOT 0.3 03/29/2018   ALKPHOS 61 03/29/2018   AST 15 03/29/2018   ALT 11 03/29/2018   PROT 7.2 05/25/2018   ALBUMIN 4.7 03/29/2018   CALCIUM 9.7 03/29/2018   GFRAA >60 04/10/2015    Speciality Comments: No specialty comments available.  Procedures:  No procedures performed Allergies: Patient has no known allergies.   Assessment / Plan:     Visit Diagnoses: No diagnosis found.  Orders: No orders of the defined types were placed in this encounter.  No orders of the defined types were placed in this encounter.   Face-to-face time spent with patient was *** minutes. Greater than 50% of time was spent in counseling and coordination of care.  Follow-Up Instructions: No follow-ups on file.   14/10/2014, CMA  Note - This record has been created using [employer].  Chart creation errors have been sought, but may not always  have been located. Such creation errors do not reflect on  the standard of medical care.

## 2019-06-30 ENCOUNTER — Ambulatory Visit: Payer: BLUE CROSS/BLUE SHIELD | Admitting: Rheumatology

## 2020-01-17 DIAGNOSIS — D225 Melanocytic nevi of trunk: Secondary | ICD-10-CM | POA: Diagnosis not present

## 2020-01-17 DIAGNOSIS — L821 Other seborrheic keratosis: Secondary | ICD-10-CM | POA: Diagnosis not present

## 2020-02-10 DIAGNOSIS — Z20822 Contact with and (suspected) exposure to covid-19: Secondary | ICD-10-CM | POA: Diagnosis not present

## 2020-02-21 ENCOUNTER — Telehealth (INDEPENDENT_AMBULATORY_CARE_PROVIDER_SITE_OTHER): Payer: BC Managed Care – PPO | Admitting: Family Medicine

## 2020-02-21 ENCOUNTER — Encounter: Payer: Self-pay | Admitting: Family Medicine

## 2020-02-21 VITALS — Temp 98.0°F | Ht 67.0 in | Wt 130.0 lb

## 2020-02-21 DIAGNOSIS — Z8616 Personal history of COVID-19: Secondary | ICD-10-CM | POA: Diagnosis not present

## 2020-02-21 DIAGNOSIS — U099 Post covid-19 condition, unspecified: Secondary | ICD-10-CM | POA: Diagnosis not present

## 2020-02-21 NOTE — Progress Notes (Signed)
New Patient Office Visit  Subjective:  Patient ID: Abigail Choi, female    DOB: [birth year]  Age: 49 y.o. MRN: [id number]  CC:  Chief Complaint  Patient presents with  . Follow-up    follow up on positive COVID test     HPI Abigail Choi presents for follow-up status post recent Covid infection.  Back on the seventh of this month she developed headache with fever chills nasal congestion postnasal drip facial pressure and loss of taste and smell.  She never developed cough shortness of breath wheezing or diarrhea.  Her symptoms have since resolved.  She is experienced malaise over 48 hours.  And it resolved.  Taste and smell have returned.  Patient completed her vaccination series in August.  Her husband who has not been vaccinated remains well as did her 25 yo daughter.  Past Medical History:  Diagnosis Date  . Frequent headaches   . RUQ abdominal pain     History reviewed. No pertinent surgical history.  Family History  Problem Relation Age of Onset  . Hypertension Father   . Heart disease Maternal Grandfather   . Cancer Paternal Grandmother   . Hypertension Paternal Grandfather   . Diabetes Paternal Grandfather   . Healthy Daughter   . Healthy Daughter   . Healthy Daughter     Social History   Socioeconomic History  . Marital status: Married    Spouse name: Not on file  . Number of children: Not on file  . Years of education: Not on file  . Highest education level: Not on file  Occupational History  . Not on file  Tobacco Use  . Smoking status: Never Smoker  . Smokeless tobacco: Never Used  Vaping Use  . Vaping Use: Never used  Substance and Sexual Activity  . Alcohol use: No  . Drug use: No  . Sexual activity: Yes    Birth control/protection: I.U.D.  Other Topics Concern  . Not on file  Social History Narrative  . Not on file   Social Determinants of Health   Financial Resource Strain:   . Difficulty of Paying Living Expenses: Not on file  Food  Insecurity:   . Worried About Programme researcher, broadcasting/film/video in the Last Year: Not on file  . Ran Out of Food in the Last Year: Not on file  Transportation Needs:   . Lack of Transportation (Medical): Not on file  . Lack of Transportation (Non-Medical): Not on file  Physical Activity:   . Days of Exercise per Week: Not on file  . Minutes of Exercise per Session: Not on file  Stress:   . Feeling of Stress : Not on file  Social Connections:   . Frequency of Communication with Friends and Family: Not on file  . Frequency of Social Gatherings with Friends and Family: Not on file  . Attends Religious Services: Not on file  . Active Member of Clubs or Organizations: Not on file  . Attends Banker Meetings: Not on file  . Marital Status: Not on file  Intimate Partner Violence:   . Fear of Current or Ex-Partner: Not on file  . Emotionally Abused: Not on file  . Physically Abused: Not on file  . Sexually Abused: Not on file    ROS Review of Systems  Constitutional: Positive for chills, fatigue and fever. Negative for diaphoresis and unexpected weight change.  HENT: Positive for congestion, postnasal drip, rhinorrhea, sinus pressure and sinus pain.  Eyes: Negative for photophobia and visual disturbance.  Respiratory: Negative for cough, chest tightness and wheezing.   Cardiovascular: Negative for chest pain.  Gastrointestinal: Negative for diarrhea.  Endocrine: Negative for polyphagia and polyuria.  Genitourinary: Negative.   Musculoskeletal: Negative for arthralgias and joint swelling.  Allergic/Immunologic: Negative for immunocompromised state.  Neurological: Positive for headaches. Negative for speech difficulty.  Hematological: Does not bruise/bleed easily.  Psychiatric/Behavioral: Negative.     Objective:   Today's Vitals: Temp 98 F (36.7 C) (Tympanic)   Ht 5\' 7"  (1.702 m)   Wt 130 lb (59 kg)   BMI 20.36 kg/m   Physical Exam Vitals and nursing note reviewed.   Constitutional:      General: She is not in acute distress.    Appearance: Normal appearance. She is not ill-appearing or toxic-appearing.  HENT:     Head: Normocephalic and atraumatic.     Right Ear: External ear normal.     Left Ear: External ear normal.  Eyes:     General: No scleral icterus.       Right eye: No discharge.        Left eye: No discharge.     Conjunctiva/sclera: Conjunctivae normal.  Pulmonary:     Effort: Pulmonary effort is normal.  Neurological:     Mental Status: She is alert and oriented to person, place, and time.  Psychiatric:        Mood and Affect: Mood normal.        Behavior: Behavior normal.     Assessment & Plan:   Problem List Items Addressed This Visit      Other   History of COVID-19 - Primary      Outpatient Encounter Medications as of 02/21/2020  Medication Sig  . levonorgestrel (MIRENA, 52 MG,) 20 MCG/24HR IUD 1 each by Intrauterine route once.  . Vitamin D, Ergocalciferol, (DRISDOL) 1.25 MG (50000 UT) CAPS capsule Take 1 capsule (50,000 Units total) by mouth every 7 (seven) days.  . cyclobenzaprine (FLEXERIL) 10 MG tablet Take 1 tablet (10 mg total) by mouth 3 (three) times daily as needed for muscle spasms. (Patient not taking: Reported on 02/21/2020)  . omeprazole (PRILOSEC) 20 MG capsule 1 tablet by mouth as needed for breakthrough reflux symptoms. (Patient not taking: Reported on 03/29/2018)  . ranitidine (ZANTAC) 150 MG tablet Take 1 tablet (150 mg total) by mouth 2 (two) times daily. (Patient not taking: Reported on 03/29/2018)   No facility-administered encounter medications on file as of 02/21/2020.    Follow-up: Return if symptoms worsen or fail to improve.   02/23/2020, MD   Virtual Visit via Video Note  I connected with Abigail Choi on 02/21/20 at  2:30 PM EDT by a video enabled telemedicine application and verified that I am speaking with the correct person using two identifiers.  Location: Patient:  home alone  Provider:   I discussed the limitations of evaluation and management by telemedicine and the availability of in person appointments. The patient expressed understanding and agreed to proceed.  History of Present Illness:    Observations/Objective:   Assessment and Plan:   Follow Up Instructions:    I discussed the assessment and treatment plan with the patient. The patient was provided an opportunity to ask questions and all were answered. The patient agreed with the plan and demonstrated an understanding of the instructions.   The patient was advised to call back or seek an in-person evaluation if the symptoms worsen or  if the condition fails to improve as anticipated.  I provided 20 minutes of non-face-to-face time during this encounter.   Abigail Sax, MD

## 2020-03-08 DIAGNOSIS — Z3041 Encounter for surveillance of contraceptive pills: Secondary | ICD-10-CM | POA: Diagnosis not present

## 2020-03-08 DIAGNOSIS — Z23 Encounter for immunization: Secondary | ICD-10-CM | POA: Diagnosis not present

## 2020-03-08 DIAGNOSIS — Z124 Encounter for screening for malignant neoplasm of cervix: Secondary | ICD-10-CM | POA: Diagnosis not present

## 2020-06-01 ENCOUNTER — Ambulatory Visit: Payer: BC Managed Care – PPO | Admitting: Nurse Practitioner

## 2020-06-01 ENCOUNTER — Encounter: Payer: Self-pay | Admitting: Nurse Practitioner

## 2020-06-01 ENCOUNTER — Other Ambulatory Visit: Payer: Self-pay

## 2020-06-01 ENCOUNTER — Telehealth: Payer: Self-pay

## 2020-06-01 VITALS — BP 122/82 | HR 106 | Temp 97.7°F | Ht 67.5 in | Wt 131.6 lb

## 2020-06-01 DIAGNOSIS — R0789 Other chest pain: Secondary | ICD-10-CM | POA: Diagnosis not present

## 2020-06-01 DIAGNOSIS — K219 Gastro-esophageal reflux disease without esophagitis: Secondary | ICD-10-CM

## 2020-06-01 DIAGNOSIS — M7918 Myalgia, other site: Secondary | ICD-10-CM | POA: Diagnosis not present

## 2020-06-01 LAB — CBC WITH DIFFERENTIAL/PLATELET
Basophils Absolute: 0 10*3/uL (ref 0.0–0.1)
Basophils Relative: 0.7 % (ref 0.0–3.0)
Eosinophils Absolute: 0 10*3/uL (ref 0.0–0.7)
Eosinophils Relative: 0.1 % (ref 0.0–5.0)
HCT: 36.7 % (ref 36.0–46.0)
Hemoglobin: 12.7 g/dL (ref 12.0–15.0)
Lymphocytes Relative: 17.6 % (ref 12.0–46.0)
Lymphs Abs: 0.8 10*3/uL (ref 0.7–4.0)
MCHC: 34.7 g/dL (ref 30.0–36.0)
MCV: 92.9 fl (ref 78.0–100.0)
Monocytes Absolute: 0.3 10*3/uL (ref 0.1–1.0)
Monocytes Relative: 5.3 % (ref 3.0–12.0)
Neutro Abs: 3.7 10*3/uL (ref 1.4–7.7)
Neutrophils Relative %: 76.3 % (ref 43.0–77.0)
Platelets: 175 10*3/uL (ref 150.0–400.0)
RBC: 3.95 Mil/uL (ref 3.87–5.11)
RDW: 12.4 % (ref 11.5–15.5)
WBC: 4.8 10*3/uL (ref 4.0–10.5)

## 2020-06-01 LAB — HEPATIC FUNCTION PANEL
ALT: 10 U/L (ref 0–35)
AST: 16 U/L (ref 0–37)
Albumin: 4.7 g/dL (ref 3.5–5.2)
Alkaline Phosphatase: 63 U/L (ref 39–117)
Bilirubin, Direct: 0.1 mg/dL (ref 0.0–0.3)
Total Bilirubin: 0.7 mg/dL (ref 0.2–1.2)
Total Protein: 7.7 g/dL (ref 6.0–8.3)

## 2020-06-01 LAB — LIPASE: Lipase: 25 U/L (ref 11.0–59.0)

## 2020-06-01 MED ORDER — OMEPRAZOLE 20 MG PO CPDR: 20.0000 mg | DELAYED_RELEASE_CAPSULE | Freq: Every day | ORAL | 3 refills | Status: DC

## 2020-06-01 MED ORDER — CYCLOBENZAPRINE HCL 5 MG PO TABS: 5.0000 mg | ORAL_TABLET | Freq: Every day | ORAL | 0 refills | Status: DC

## 2020-06-01 NOTE — Patient Instructions (Addendum)
ECG does not show any change from 2019.  Go to lab for blood draw Start omeprazole 20mg  daily x7days. Use tylenol 500mg  every 8hrs as needed for pain. Can also use flexeril 5-10mg  at bedtime as needed for muscle cramp.

## 2020-06-01 NOTE — Progress Notes (Signed)
Subjective:  Patient ID: Abigail Choi, female    DOB: [birth year]  Age: 50 y.o. MRN: [id number]  CC: Acute Visit (Pt c/o chest and arm pain, pain starts in back, eft side pt states it comes and goes, it flares up from time to time, 2-3 months with consistency, last night was worst, pt states she also has tightness in her neck and is experiencing some neck pain. Pt states she has taken ibuprofen pot denies injury )  Chest Pain  This is a recurrent problem. The current episode started more than 1 year ago. The onset quality is gradual. The problem occurs intermittently. The problem has been waxing and waning. The pain is present in the substernal region. The quality of the pain is described as dull. The pain radiates to the left arm and mid back. Associated symptoms include abdominal pain and back pain. Pertinent negatives include no claudication, cough, diaphoresis, dizziness, exertional chest pressure, fever, headaches, hemoptysis, irregular heartbeat, leg pain, lower extremity edema, malaise/fatigue, nausea, near-syncope, numbness, orthopnea, palpitations, PND, shortness of breath, sputum production, syncope, vomiting or weakness. The pain is aggravated by nothing. She has tried NSAIDs for the symptoms. The treatment provided significant relief. Risk factors include stress.  Her past medical history is significant for anxiety/panic attacks.  Pertinent negatives for past medical history include no CAD, no COPD, no diabetes, no hypertension, no sleep apnea and no thyroid problem.  Pertinent negatives for family medical history include: no CAD, no diabetes, no heart disease, no hyperlipidemia, no hypertension, no early MI, no PE and no sudden death. Prior diagnostic workup includes exercise treadmill test and chest x-ray.  reports similar symptoms in past-2019, had cardiac workup which were all negative. Reviewed previous labs results (CBC, ANA, CMP, lipase, ESR, troponin, and H. Pylori), stress test  report, CXR and thoracic spine x-ray. Unable to localize pain today and states that pain moves around. Unable to identify any specify trigger. States pain was worse last night and moved to L. Upper arm. No other symptoms noted.  Reviewed past Medical, Social and Family history today.  Outpatient Medications Prior to Visit  Medication Sig Dispense Refill  . levonorgestrel (MIRENA) 20 MCG/24HR IUD 1 each by Intrauterine route once.    . Vitamin D, Ergocalciferol, (DRISDOL) 1.25 MG (50000 UT) CAPS capsule Take 1 capsule (50,000 Units total) by mouth every 7 (seven) days. 12 capsule 0  . cyclobenzaprine (FLEXERIL) 10 MG tablet Take 1 tablet (10 mg total) by mouth 3 (three) times daily as needed for muscle spasms. (Patient not taking: Reported on 06/01/2020) 30 tablet 0  . omeprazole (PRILOSEC) 20 MG capsule 1 tablet by mouth as needed for breakthrough reflux symptoms. (Patient not taking: No sig reported) 30 capsule 3  . ranitidine (ZANTAC) 150 MG tablet Take 1 tablet (150 mg total) by mouth 2 (two) times daily. (Patient not taking: No sig reported) 60 tablet 3   No facility-administered medications prior to visit.   ROS See HPI  Objective:  BP 122/82 (BP Location: Right Arm, Patient Position: Sitting, Cuff Size: Normal)   Pulse (!) 106   Temp 97.7 F (36.5 C) (Temporal)   Ht 5' 7.5" (1.715 m)   Wt 131 lb 9.6 oz (59.7 kg)   SpO2 98%   BMI 20.31 kg/m   ECG: NSR, no changed compared to 2019 tracing.  Physical Exam Vitals reviewed.  Constitutional:      General: She is not in acute distress. Cardiovascular:     Rate and  Rhythm: Normal rate and regular rhythm.     Pulses: Normal pulses.     Heart sounds: Normal heart sounds.  Pulmonary:     Breath sounds: Normal breath sounds.  Abdominal:     General: There is no distension.     Palpations: Abdomen is soft.     Tenderness: There is no abdominal tenderness. There is no right CVA tenderness, left CVA tenderness or guarding.   Musculoskeletal:        General: Tenderness present. Normal range of motion.     Cervical back: Normal range of motion and neck supple.     Right lower leg: No edema.     Left lower leg: No edema.  Lymphadenopathy:     Cervical: No cervical adenopathy.  Skin:    Findings: No erythema or rash.  Neurological:     Mental Status: She is alert and oriented to person, place, and time.  Psychiatric:        Mood and Affect: Mood is anxious.        Speech: Speech normal.        Behavior: Behavior is cooperative.    Assessment & Plan:  This visit occurred during the SARS-CoV-2 public health emergency.  Safety protocols were in place, including screening questions prior to the visit, additional usage of staff PPE, and extensive cleaning of exam room while observing appropriate contact time as indicated for disinfecting solutions.   Anaaya was seen today for acute visit.  Diagnoses and all orders for this visit:  Atypical chest pain -     EKG 12-Lead -     Lipase -     Hepatic function panel -     CBC with Differential/Platelet  Gastroesophageal reflux disease, unspecified whether esophagitis present -     omeprazole (PRILOSEC) 20 MG capsule; Take 1 capsule (20 mg total) by mouth daily. 1 tablet by mouth as needed for breakthrough reflux symptoms.  Myofascial pain -     cyclobenzaprine (FLEXERIL) 5 MG tablet; Take 1-2 tablets (5-10 mg total) by mouth at bedtime.  Do not think symptoms are cardiac related. I think GERD vs musculoskeletal related. Go to lab for blood draw Start omeprazole 20mg daily x7days. Use tylenol 500mg every 8hrs as needed for pain. Can also use flexeril 5-10mg at bedtime as needed for muscle cramp.  Problem List Items Addressed This Visit      Digestive   GERD (gastroesophageal reflux disease)   Relevant Medications   omeprazole (PRILOSEC) 20 MG capsule     Other   Myofascial pain   Relevant Medications   cyclobenzaprine (FLEXERIL) 5 MG tablet     Other Visit Diagnoses    Atypical chest pain    -  Primary   Relevant Orders   EKG 12-Lead (Completed)   Lipase   Hepatic function panel   CBC with Differential/Platelet      Follow-up: No follow-ups on file.  Charlotte Nche, NP 

## 2020-06-01 NOTE — Telephone Encounter (Signed)
Pt calling to c/o have arm, chest, back and neck pain on the lt side of her body.  Pt said that she has been experiencing this for a couple of months now but it worsened this morning.  Pt has a NP appointment w/ Dr. Salena Saner in February, but was wanting to be seen today.  I transferred the call to our Nurse Triage due to the chest pain.

## 2020-06-05 LAB — HM PAP SMEAR: HM Pap smear: NEGATIVE

## 2020-06-15 DIAGNOSIS — R350 Frequency of micturition: Secondary | ICD-10-CM | POA: Diagnosis not present

## 2020-06-15 DIAGNOSIS — Z01419 Encounter for gynecological examination (general) (routine) without abnormal findings: Secondary | ICD-10-CM | POA: Diagnosis not present

## 2020-06-15 DIAGNOSIS — Z1231 Encounter for screening mammogram for malignant neoplasm of breast: Secondary | ICD-10-CM | POA: Diagnosis not present

## 2020-06-15 DIAGNOSIS — R87610 Atypical squamous cells of undetermined significance on cytologic smear of cervix (ASC-US): Secondary | ICD-10-CM | POA: Diagnosis not present

## 2020-06-15 DIAGNOSIS — Z6820 Body mass index (BMI) 20.0-20.9, adult: Secondary | ICD-10-CM | POA: Diagnosis not present

## 2020-06-15 DIAGNOSIS — Z124 Encounter for screening for malignant neoplasm of cervix: Secondary | ICD-10-CM | POA: Diagnosis not present

## 2020-06-15 LAB — HM PAP SMEAR

## 2020-06-15 LAB — RESULTS CONSOLE HPV: CHL HPV: NEGATIVE

## 2020-06-27 ENCOUNTER — Encounter: Payer: BC Managed Care – PPO | Admitting: Family Medicine

## 2020-07-03 NOTE — Patient Instructions (Addendum)
[facility] - Dr. Tressia Danas   Health Maintenance Due  Topic Date Due  . Hepatitis C Screening  Never done  . HIV Screening  Never done  . TETANUS/TDAP Will receive today. Never done  . COLONOSCOPY (Pts 45-72yrs Insurance coverage will need to be confirmed) Will receive referral today during visit. Never done  . PAP SMEAR-Modifier UTD 06/11/2019  . INFLUENZA VACCINE Declined today 12/04/2019  . COVID-19 Vaccine (3 - Booster for [employer] series)Patient has had 2 injections, Pfizer. 07/02/2020    Depression screen PHQ 2/9 02/09/2018 01/27/2017 04/18/2015  Decreased Interest 0 1 0  Down, Depressed, Hopeless 0 2 0  PHQ - 2 Score 0 3 0  Altered sleeping - 1 -  Tired, decreased energy - 2 -  Change in appetite - 2 -  Feeling bad or failure about yourself  - 2 -  Trouble concentrating - 0 -  Moving slowly or fidgety/restless - 0 -  Suicidal thoughts - 0 -  PHQ-9 Score - 10 -      Health Maintenance, Female Adopting a healthy lifestyle and getting preventive care are important in promoting health and wellness. Ask your health care provider about:  The right schedule for you to have regular tests and exams.  Things you can do on your own to prevent diseases and keep yourself healthy. What should I know about diet, weight, and exercise? Eat a healthy diet  Eat a diet that includes plenty of vegetables, fruits, low-fat dairy products, and lean protein.  Do not eat a lot of foods that are high in solid fats, added sugars, or sodium.   Maintain a healthy weight Body mass index (BMI) is used to identify weight problems. It estimates body fat based on height and weight. Your health care provider can help determine your BMI and help you achieve or maintain a healthy weight. Get regular exercise Get regular exercise. This is one of the most important things you can do for your health. Most adults should:  Exercise for at least 150 minutes each week. The exercise should  increase your heart rate and make you sweat (moderate-intensity exercise).  Do strengthening exercises at least twice a week. This is in addition to the moderate-intensity exercise.  Spend less time sitting. Even light physical activity can be beneficial. Watch cholesterol and blood lipids Have your blood tested for lipids and cholesterol at 49 years of age, then have this test every 5 years. Have your cholesterol levels checked more often if:  Your lipid or cholesterol levels are high.  You are older than 49 years of age.  You are at high risk for heart disease. What should I know about cancer screening? Depending on your health history and family history, you may need to have cancer screening at various ages. This may include screening for:  Breast cancer.  Cervical cancer.  Colorectal cancer.  Skin cancer.  Lung cancer. What should I know about heart disease, diabetes, and high blood pressure? Blood pressure and heart disease  High blood pressure causes heart disease and increases the risk of stroke. This is more likely to develop in people who have high blood pressure readings, are of African descent, or are overweight.  Have your blood pressure checked: ? Every 3-5 years if you are 32-24 years of age. ? Every year if you are 42 years old or older. Diabetes Have regular diabetes screenings. This checks your fasting blood sugar level. Have the screening done:  Once every three years  after age 20 if you are at a normal weight and have a low risk for diabetes.  More often and at a younger age if you are overweight or have a high risk for diabetes. What should I know about preventing infection? Hepatitis B If you have a higher risk for hepatitis B, you should be screened for this virus. Talk with your health care provider to find out if you are at risk for hepatitis B infection. Hepatitis C Testing is recommended for:  Everyone born from [birth year] through 1964.  Anyone with  known risk factors for hepatitis C. Sexually transmitted infections (STIs)  Get screened for STIs, including gonorrhea and chlamydia, if: ? You are sexually active and are younger than 49 years of age. ? You are older than 49 years of age and your health care provider tells you that you are at risk for this type of infection. ? Your sexual activity has changed since you were last screened, and you are at increased risk for chlamydia or gonorrhea. Ask your health care provider if you are at risk.  Ask your health care provider about whether you are at high risk for HIV. Your health care provider may recommend a prescription medicine to help prevent HIV infection. If you choose to take medicine to prevent HIV, you should first get tested for HIV. You should then be tested every 3 months for as long as you are taking the medicine. Pregnancy  If you are about to stop having your period (premenopausal) and you may become pregnant, seek counseling before you get pregnant.  Take 400 to 800 micrograms (mcg) of folic acid every day if you become pregnant.  Ask for birth control (contraception) if you want to prevent pregnancy. Osteoporosis and menopause Osteoporosis is a disease in which the bones lose minerals and strength with aging. This can result in bone fractures. If you are 69 years old or older, or if you are at risk for osteoporosis and fractures, ask your health care provider if you should:  Be screened for bone loss.  Take a calcium or vitamin D supplement to lower your risk of fractures.  Be given hormone replacement therapy (HRT) to treat symptoms of menopause. Follow these instructions at home: Lifestyle  Do not use any products that contain nicotine or tobacco, such as cigarettes, e-cigarettes, and chewing tobacco. If you need help quitting, ask your health care provider.  Do not use street drugs.  Do not share needles.  Ask your health care provider for help if you need  support or information about quitting drugs. Alcohol use  Do not drink alcohol if: ? Your health care provider tells you not to drink. ? You are pregnant, may be pregnant, or are planning to become pregnant.  If you drink alcohol: ? Limit how much you use to 0-1 drink a day. ? Limit intake if you are breastfeeding.  Be aware of how much alcohol is in your drink. In the [place] one drink equals one 12 oz bottle of beer (355 mL), one 5 oz glass of wine (148 mL), or one 1 oz glass of hard liquor (44 mL). General instructions  Schedule regular health, dental, and eye exams.  Stay current with your vaccines.  Tell your health care provider if: ? You often feel depressed. ? You have ever been abused or do not feel safe at home. Summary  Adopting a healthy lifestyle and getting preventive care are important in promoting health and wellness.  Follow  your health care provider's instructions about healthy diet, exercising, and getting tested or screened for diseases.  Follow your health care provider's instructions on monitoring your cholesterol and blood pressure. This information is not intended to replace advice given to you by your health care provider. Make sure you discuss any questions you have with your health care provider. Document Revised: 04/14/2018 Document Reviewed: 04/14/2018 Elsevier Patient Education  2021 [employer].

## 2020-07-04 ENCOUNTER — Other Ambulatory Visit: Payer: Self-pay

## 2020-07-06 ENCOUNTER — Encounter: Payer: Self-pay | Admitting: Family Medicine

## 2020-07-06 ENCOUNTER — Other Ambulatory Visit: Payer: Self-pay

## 2020-07-06 ENCOUNTER — Ambulatory Visit (INDEPENDENT_AMBULATORY_CARE_PROVIDER_SITE_OTHER): Payer: BC Managed Care – PPO | Admitting: Family Medicine

## 2020-07-06 VITALS — HR 79 | Temp 97.7°F | Ht 67.75 in | Wt 131.8 lb

## 2020-07-06 DIAGNOSIS — K219 Gastro-esophageal reflux disease without esophagitis: Secondary | ICD-10-CM | POA: Diagnosis not present

## 2020-07-06 DIAGNOSIS — Z13220 Encounter for screening for lipoid disorders: Secondary | ICD-10-CM | POA: Diagnosis not present

## 2020-07-06 DIAGNOSIS — Z1211 Encounter for screening for malignant neoplasm of colon: Secondary | ICD-10-CM | POA: Diagnosis not present

## 2020-07-06 DIAGNOSIS — Z0000 Encounter for general adult medical examination without abnormal findings: Secondary | ICD-10-CM | POA: Diagnosis not present

## 2020-07-06 DIAGNOSIS — E559 Vitamin D deficiency, unspecified: Secondary | ICD-10-CM | POA: Insufficient documentation

## 2020-07-06 DIAGNOSIS — Z23 Encounter for immunization: Secondary | ICD-10-CM

## 2020-07-06 DIAGNOSIS — M7918 Myalgia, other site: Secondary | ICD-10-CM

## 2020-07-06 LAB — ALT: ALT: 11 U/L (ref 0–35)

## 2020-07-06 LAB — CBC
HCT: 35.6 % — ABNORMAL LOW (ref 36.0–46.0)
Hemoglobin: 12.5 g/dL (ref 12.0–15.0)
MCHC: 35 g/dL (ref 30.0–36.0)
MCV: 92.8 fl (ref 78.0–100.0)
Platelets: 170 10*3/uL (ref 150.0–400.0)
RBC: 3.83 Mil/uL — ABNORMAL LOW (ref 3.87–5.11)
RDW: 12.1 % (ref 11.5–15.5)
WBC: 3.9 10*3/uL — ABNORMAL LOW (ref 4.0–10.5)

## 2020-07-06 LAB — BASIC METABOLIC PANEL
BUN: 16 mg/dL (ref 6–23)
CO2: 30 mEq/L (ref 19–32)
Calcium: 9.9 mg/dL (ref 8.4–10.5)
Chloride: 105 mEq/L (ref 96–112)
Creatinine, Ser: 0.75 mg/dL (ref 0.40–1.20)
GFR: 93.87 mL/min (ref >60.00)
Glucose, Bld: 82 mg/dL (ref 70–99)
Potassium: 4.3 mEq/L (ref 3.5–5.1)
Sodium: 141 mEq/L (ref 135–145)

## 2020-07-06 LAB — LIPID PANEL
Cholesterol: 165 mg/dL (ref 0–200)
HDL: 66 mg/dL (ref >39.00)
LDL Cholesterol: 90 mg/dL (ref 0–99)
NonHDL: 99.26
Total CHOL/HDL Ratio: 3
Triglycerides: 46 mg/dL (ref 0.0–149.0)
VLDL: 9.2 mg/dL (ref 0.0–40.0)

## 2020-07-06 LAB — AST: AST: 14 U/L (ref 0–37)

## 2020-07-06 LAB — VITAMIN D 25 HYDROXY (VIT D DEFICIENCY, FRACTURES): VITD: 26.77 ng/mL — ABNORMAL LOW (ref 30.00–100.00)

## 2020-07-06 MED ORDER — CYCLOBENZAPRINE HCL 5 MG PO TABS: 5.0000 mg | ORAL_TABLET | Freq: Every day | ORAL | 0 refills | Status: DC

## 2020-07-06 NOTE — Progress Notes (Signed)
ARNEDA SAPPINGTON is a 50 y.o. female  Chief Complaint  Patient presents with  . Establish Care    TOC, CPE.  Pt not fasting for work.  Pt is UTD w/pap, and mammogram and will receive Tdap today. Medication refill and Colonoscopy referral.    HPI: MIA MILAN is a 50 y.o. female seen today for TOC appt - previously seen by Dr. Dayton Martes - and annual CPE, fasting labs. She had a granola bar this AM. She would like Tdap vaccine today.   Last PAP: UTD, follows with GYN Dr. Waynard Reeds Last mammo: UTD - done with GYN Last colonoscopy: due and would like referral and would prefer female provider  Dental: UTD Vision: UTD - wears glasses  Med refills needed today? Yes per orders   Past Medical History:  Diagnosis Date  . Frequent headaches   . RUQ abdominal pain     History reviewed. No pertinent surgical history.  Social History   Socioeconomic History  . Marital status: Married    Spouse name: Not on file  . Number of children: Not on file  . Years of education: Not on file  . Highest education level: Not on file  Occupational History  . Not on file  Tobacco Use  . Smoking status: Never Smoker  . Smokeless tobacco: Never Used  Vaping Use  . Vaping Use: Never used  Substance and Sexual Activity  . Alcohol use: No  . Drug use: No  . Sexual activity: Yes    Birth control/protection: I.U.D.  Other Topics Concern  . Not on file  Social History Narrative  . Not on file   Social Determinants of Health   Financial Resource Strain: Not on file  Food Insecurity: Not on file  Transportation Needs: Not on file  Physical Activity: Not on file  Stress: Not on file  Social Connections: Not on file  Intimate Partner Violence: Not on file    Family History  Problem Relation Age of Onset  . Hypertension Father   . Heart disease Maternal Grandfather   . Cancer Paternal Grandmother   . Hypertension Paternal Grandfather   . Diabetes Paternal Grandfather   . Healthy  Daughter   . Healthy Daughter   . Healthy Daughter      Immunization History  Administered Date(s) Administered  . Influenza,inj,Quad PF,6+ Mos 03/27/2014, 04/18/2015  . Influenza-Unspecified 03/05/2017  . PFIZER Comirnaty(Gray Top)Covid-19 Tri-Sucrose Vaccine 11/28/2019, 12/19/2019  . PFIZER(Purple Top)SARS-COV-2 Vaccination 12/04/2019, 01/02/2020    Outpatient Encounter Medications as of 07/06/2020  Medication Sig  . levonorgestrel (MIRENA) 20 MCG/24HR IUD 1 each by Intrauterine route once.  . [DISCONTINUED] cyclobenzaprine (FLEXERIL) 5 MG tablet Take 1-2 tablets (5-10 mg total) by mouth at bedtime.  . cyclobenzaprine (FLEXERIL) 5 MG tablet Take 1-2 tablets (5-10 mg total) by mouth at bedtime.  . [DISCONTINUED] omeprazole (PRILOSEC) 20 MG capsule Take 1 capsule (20 mg total) by mouth daily. 1 tablet by mouth as needed for breakthrough reflux symptoms. (Patient not taking: Reported on 07/06/2020)  . [DISCONTINUED] Vitamin D, Ergocalciferol, (DRISDOL) 1.25 MG (50000 UT) CAPS capsule Take 1 capsule (50,000 Units total) by mouth every 7 (seven) days. (Patient not taking: Reported on 07/06/2020)   No facility-administered encounter medications on file as of 07/06/2020.     ROS: Gen: no fever, chills  Skin: no rash, itching ENT: no ear pain, ear drainage, nasal congestion, rhinorrhea, sinus pressure, sore throat Eyes: no blurry vision, double vision Resp: no cough, wheeze,SOB  Breast: no breast tenderness, no nipple discharge, no breast masses CV: no CP, palpitations, LE edema,  GI: no heartburn, n/v/d/c, abd pain GU: no dysuria, urgency, frequency, hematuria MSK: MSK chest pain, on/off x years Neuro: no dizziness, headache, weakness, vertigo Psych: no depression, anxiety, insomnia   No Known Allergies  Pulse 79   Temp 97.7 F (36.5 C) (Temporal)   Ht 5' 7.75" (1.721 m)   Wt 131 lb 12.8 oz (59.8 kg)   SpO2 99%   BMI 20.19 kg/m   Wt Readings from Last 3 Encounters:  07/06/20 131  lb 12.8 oz (59.8 kg)  06/01/20 131 lb 9.6 oz (59.7 kg)  02/21/20 130 lb (59 kg)   Temp Readings from Last 3 Encounters:  07/06/20 97.7 F (36.5 C) (Temporal)  06/01/20 97.7 F (36.5 C) (Temporal)  02/21/20 98 F (36.7 C) (Tympanic)   BP Readings from Last 3 Encounters:  06/01/20 122/82  06/22/18 (!) 100/55  05/25/18 110/72   Pulse Readings from Last 3 Encounters:  07/06/20 79  06/01/20 (!) 106  06/22/18 71     Physical Exam Constitutional:      General: She is not in acute distress.    Appearance: She is well-developed and well-nourished.  HENT:     Head: Normocephalic and atraumatic.     Right Ear: Tympanic membrane and ear canal normal.     Left Ear: Tympanic membrane and ear canal normal.     Nose: Nose normal.     Mouth/Throat:     Mouth: Oropharynx is clear and moist and mucous membranes are normal. Mucous membranes are moist.     Pharynx: Oropharynx is clear.  Eyes:     Conjunctiva/sclera: Conjunctivae normal.  Neck:     Thyroid: No thyromegaly.  Cardiovascular:     Rate and Rhythm: Normal rate and regular rhythm.     Pulses: Intact distal pulses.     Heart sounds: Normal heart sounds. No murmur heard.   Pulmonary:     Effort: Pulmonary effort is normal. No respiratory distress.     Breath sounds: Normal breath sounds. No wheezing or rhonchi.  Abdominal:     General: Bowel sounds are normal. There is no distension.     Palpations: Abdomen is soft. There is no mass.     Tenderness: There is no abdominal tenderness.  Musculoskeletal:        General: No edema.     Cervical back: Neck supple.     Right lower leg: No edema.     Left lower leg: No edema.  Lymphadenopathy:     Cervical: No cervical adenopathy.  Skin:    General: Skin is warm and dry.  Neurological:     Mental Status: She is alert and oriented to person, place, and time.     Motor: No abnormal muscle tone.     Coordination: Coordination normal.  Psychiatric:        Mood and Affect:  Mood and affect normal.        Behavior: Behavior normal.      A/P:  1. Annual physical exam - discussed importance of regular CV exercise, healthy diet, adequate sleep - UTD on mammo and PAP - colonoscopy referral placed today - UTD on dental and vision exams - CBC - Basic metabolic panel - AST - ALT - next CPE in 1 year  2. Vitamin D deficiency - VITAMIN D 25 Hydroxy (Vit-D Deficiency, Fractures)  3. Gastroesophageal reflux disease, unspecified whether esophagitis present  4. Screening for colon cancer - Ambulatory referral to Gastroenterology  5. Screening for lipid disorders - Lipid panel  6. Need for Tdap vaccination - Tdap vaccine greater than or equal to 7yo IM  7. Myofascial pain - chronic, intermittent Refill: - cyclobenzaprine (FLEXERIL) 5 MG tablet; Take 1-2 tablets (5-10 mg total) by mouth at bedtime.  Dispense: 30 tablet; Refill: 0    This visit occurred during the SARS-CoV-2 public health emergency.  Safety protocols were in place, including screening questions prior to the visit, additional usage of staff PPE, and extensive cleaning of exam room while observing appropriate contact time as indicated for disinfecting solutions.

## 2020-08-16 ENCOUNTER — Encounter: Payer: Self-pay | Admitting: Gastroenterology

## 2020-10-17 ENCOUNTER — Encounter: Payer: BC Managed Care – PPO | Admitting: Gastroenterology

## 2020-11-01 ENCOUNTER — Ambulatory Visit
Admission: RE | Admit: 2020-11-01 | Discharge: 2020-11-01 | Disposition: A | Discharge status: Home / Self Care | Payer: BC Managed Care – PPO | Source: Ambulatory Visit | Attending: Physician Assistant | Admitting: Physician Assistant

## 2020-11-01 ENCOUNTER — Other Ambulatory Visit: Payer: Self-pay

## 2020-11-01 ENCOUNTER — Other Ambulatory Visit: Payer: Self-pay | Admitting: Physician Assistant

## 2020-11-01 DIAGNOSIS — M79674 Pain in right toe(s): Secondary | ICD-10-CM

## 2020-11-01 DIAGNOSIS — S92411A Displaced fracture of proximal phalanx of right great toe, initial encounter for closed fracture: Secondary | ICD-10-CM | POA: Diagnosis not present

## 2020-11-01 DIAGNOSIS — S92421A Displaced fracture of distal phalanx of right great toe, initial encounter for closed fracture: Secondary | ICD-10-CM | POA: Diagnosis not present

## 2020-11-02 DIAGNOSIS — M79674 Pain in right toe(s): Secondary | ICD-10-CM | POA: Diagnosis not present

## 2020-11-02 HISTORY — PX: TOE SURGERY: SHX1073

## 2020-11-08 DIAGNOSIS — X58XXXA Exposure to other specified factors, initial encounter: Secondary | ICD-10-CM | POA: Diagnosis not present

## 2020-11-08 DIAGNOSIS — Y999 Unspecified external cause status: Secondary | ICD-10-CM | POA: Diagnosis not present

## 2020-11-08 DIAGNOSIS — S92411A Displaced fracture of proximal phalanx of right great toe, initial encounter for closed fracture: Secondary | ICD-10-CM | POA: Diagnosis not present

## 2020-11-23 DIAGNOSIS — S92411A Displaced fracture of proximal phalanx of right great toe, initial encounter for closed fracture: Secondary | ICD-10-CM | POA: Diagnosis not present

## 2020-12-03 LAB — HM COLONOSCOPY

## 2020-12-10 ENCOUNTER — Other Ambulatory Visit: Payer: Self-pay

## 2020-12-10 ENCOUNTER — Ambulatory Visit (AMBULATORY_SURGERY_CENTER): Payer: BC Managed Care – PPO

## 2020-12-10 ENCOUNTER — Other Ambulatory Visit: Payer: Self-pay | Admitting: Gastroenterology

## 2020-12-10 VITALS — Ht 67.5 in | Wt 130.0 lb

## 2020-12-10 DIAGNOSIS — Z1211 Encounter for screening for malignant neoplasm of colon: Secondary | ICD-10-CM

## 2020-12-10 MED ORDER — PEG-KCL-NACL-NASULF-NA ASC-C 100 G PO SOLR: 1.0000 | Freq: Once | ORAL | 0 refills | Status: AC

## 2020-12-10 NOTE — Telephone Encounter (Signed)
Patient is to present a coupon at time of pick up

## 2020-12-10 NOTE — Progress Notes (Signed)
Pre visit completed via phone call; Patient verified name, DOB, and address;  No egg or soy allergy known to patient  No issues with past sedation with any surgeries or procedures Patient denies ever being told they had issues or difficulty with intubation  No FH of Malignant Hyperthermia No diet pills per patient No home 02 use per patient  No blood thinners per patient  Pt denies issues with constipation  No A fib or A flutter  EMMI video via MyChart  COVID 19 guidelines implemented in PV today with Pt and RN  Pt is fully vaccinated for Covid x 2; Coupon given to pt in PV today and NO PA's for preps discussed with pt in PV today  Discussed with pt there will be an out-of-pocket cost for prep and that varies from $0 to 70 dollars  Due to the COVID-19 pandemic we are asking patients to follow certain guidelines.  Pt aware of COVID protocols and [facility] guidelines

## 2020-12-19 DIAGNOSIS — S92411A Displaced fracture of proximal phalanx of right great toe, initial encounter for closed fracture: Secondary | ICD-10-CM | POA: Diagnosis not present

## 2020-12-20 ENCOUNTER — Encounter: Payer: Self-pay | Admitting: Gastroenterology

## 2020-12-24 ENCOUNTER — Encounter: Payer: Self-pay | Admitting: Gastroenterology

## 2020-12-24 ENCOUNTER — Ambulatory Visit (AMBULATORY_SURGERY_CENTER): Payer: BC Managed Care – PPO | Admitting: Gastroenterology

## 2020-12-24 ENCOUNTER — Other Ambulatory Visit: Payer: Self-pay

## 2020-12-24 VITALS — BP 103/64 | HR 67 | Temp 98.7°F | Resp 19 | Ht 67.5 in | Wt 130.0 lb

## 2020-12-24 DIAGNOSIS — Z1211 Encounter for screening for malignant neoplasm of colon: Secondary | ICD-10-CM

## 2020-12-24 MED ORDER — SODIUM CHLORIDE 0.9 % IV SOLN: 500.0000 mL | Freq: Once | INTRAVENOUS | Status: AC

## 2020-12-24 NOTE — Progress Notes (Signed)
Report given to PACU, vss 

## 2020-12-24 NOTE — Progress Notes (Signed)
Pt's states no medical or surgical changes since previsit or office visit.   CW VS 

## 2020-12-24 NOTE — Patient Instructions (Signed)
Resume previous diet and medications. Repeat Colonoscopy in 10 years for screening purposes. ° °YOU HAD AN ENDOSCOPIC PROCEDURE TODAY AT THE [facility]:   Refer to the procedure report that was given to you for any specific questions about what was found during the examination.  If the procedure report does not answer your questions, please call your gastroenterologist to clarify.  If you requested that your care partner not be given the details of your procedure findings, then the procedure report has been included in a sealed envelope for you to review at your convenience later. ° °YOU SHOULD EXPECT: Some feelings of bloating in the abdomen. Passage of more gas than usual.  Walking can help get rid of the air that was put into your GI tract during the procedure and reduce the bloating. If you had a lower endoscopy (such as a colonoscopy or flexible sigmoidoscopy) you may notice spotting of blood in your stool or on the toilet paper. If you underwent a bowel prep for your procedure, you may not have a normal bowel movement for a few days. ° °Please Note:  You might notice some irritation and congestion in your nose or some drainage.  This is from the oxygen used during your procedure.  There is no need for concern and it should clear up in a day or so. ° °SYMPTOMS TO REPORT IMMEDIATELY: ° °Following lower endoscopy (colonoscopy or flexible sigmoidoscopy): ° Excessive amounts of blood in the stool ° Significant tenderness or worsening of abdominal pains ° Swelling of the abdomen that is new, acute ° Fever of 100°F or higher ° °For urgent or emergent issues, a gastroenterologist can be reached at any hour by calling [phone]. °Do not use MyChart messaging for urgent concerns.  ° ° °DIET:  We do recommend a small meal at first, but then you may proceed to your regular diet.  Drink plenty of fluids but you should avoid alcoholic beverages for 24 hours. ° °ACTIVITY:  You should plan to take it easy  for the rest of today and you should NOT DRIVE or use heavy machinery until tomorrow (because of the sedation medicines used during the test).   ° °FOLLOW UP: °Our staff will call the number listed on your records 48-72 hours following your procedure to check on you and address any questions or concerns that you may have regarding the information given to you following your procedure. If we do not reach you, we will leave a message.  We will attempt to reach you two times.  During this call, we will ask if you have developed any symptoms of COVID 19. If you develop any symptoms (ie: fever, flu-like symptoms, shortness of breath, cough etc.) before then, please call [phone].  If you test positive for Covid 19 in the 2 weeks post procedure, please call and report this information to us.   ° °If any biopsies were taken you will be contacted by phone or by letter within the next 1-3 weeks.  Please call us at [phone] if you have not heard about the biopsies in 3 weeks.  ° ° °SIGNATURES/CONFIDENTIALITY: °You and/or your care partner have signed paperwork which will be entered into your electronic medical record.  These signatures attest to the fact that that the information above on your After Visit Summary has been reviewed and is understood.  Full responsibility of the confidentiality of this discharge information lies with you and/or your care-partner.  °

## 2020-12-24 NOTE — Op Note (Signed)
[facility] Patient Name: Abigail Choi Procedure Date: 12/24/2020 10:53 AM MRN: [id number] Endoscopist: Napoleon Form , MD Age: 50 Referring MD:  Date of Birth: May 26, 1970 Gender: Female Account #: [id number] Procedure:                Colonoscopy Indications:              Screening for colorectal malignant neoplasm Medicines:                Monitored Anesthesia Care Procedure:                Pre-Anesthesia Assessment:                           - Prior to the procedure, a History and Physical                            was performed, and patient medications and                            allergies were reviewed. The patient's tolerance of                            previous anesthesia was also reviewed. The risks                            and benefits of the procedure and the sedation                            options and risks were discussed with the patient.                            All questions were answered, and informed consent                            was obtained. Prior Anticoagulants: The patient has                            taken no previous anticoagulant or antiplatelet                            agents. ASA Grade Assessment: II - A patient with                            mild systemic disease. After reviewing the risks                            and benefits, the patient was deemed in                            satisfactory condition to undergo the procedure.                           After obtaining informed consent, the colonoscope  was passed under direct vision. Throughout the                            procedure, the patient's blood pressure, pulse, and                            oxygen saturations were monitored continuously. The                            Olympus PCF-[id number] (#[id number]) Colonoscope was                            introduced through the anus and advanced to the the                            cecum,  identified by appendiceal orifice and                            ileocecal valve. The colonoscopy was performed                            without difficulty. The patient tolerated the                            procedure well. The quality of the bowel                            preparation was excellent. The ileocecal valve,                            appendiceal orifice, and rectum were photographed. Scope In: 11:02:19 AM Scope Out: 11:17:59 AM Scope Withdrawal Time: 0 hours 8 minutes 41 seconds  Total Procedure Duration: 0 hours 15 minutes 40 seconds  Findings:                 The perianal and digital rectal examinations were                            normal.                           Scattered small and large-mouthed diverticula were                            found in the sigmoid colon.                           Non-bleeding external and internal hemorrhoids were                            found during retroflexion. The hemorrhoids were                            medium-sized. Complications:            No immediate complications. Estimated Blood Loss:  Estimated blood loss was minimal. Impression:               - Diverticulosis in the sigmoid colon.                           - Non-bleeding external and internal hemorrhoids.                           - No specimens collected. Recommendation:           - Patient has a contact number available for                            emergencies. The signs and symptoms of potential                            delayed complications were discussed with the                            patient. Return to normal activities tomorrow.                            Written discharge instructions were provided to the                            patient.                           - Resume previous diet.                           - Continue present medications.                           - Repeat colonoscopy in 10 years for screening                             purposes. Napoleon Form, MD 12/24/2020 11:23:38 AM This report has been signed electronically.

## 2020-12-26 ENCOUNTER — Telehealth: Payer: Self-pay

## 2020-12-26 ENCOUNTER — Encounter: Payer: Self-pay | Admitting: Family Medicine

## 2020-12-26 DIAGNOSIS — K649 Unspecified hemorrhoids: Secondary | ICD-10-CM | POA: Insufficient documentation

## 2020-12-26 DIAGNOSIS — K5790 Diverticulosis of intestine, part unspecified, without perforation or abscess without bleeding: Secondary | ICD-10-CM | POA: Insufficient documentation

## 2020-12-26 NOTE — Telephone Encounter (Signed)
Attempted f/u call. No answer, left VM. 

## 2020-12-26 NOTE — Telephone Encounter (Signed)
Left message on follow up call. 

## 2021-03-11 ENCOUNTER — Ambulatory Visit: Payer: BC Managed Care – PPO | Admitting: Family Medicine

## 2021-03-11 ENCOUNTER — Encounter: Payer: Self-pay | Admitting: Family Medicine

## 2021-03-11 ENCOUNTER — Other Ambulatory Visit: Payer: Self-pay

## 2021-03-11 VITALS — BP 102/68 | HR 82 | Temp 97.2°F | Ht 67.0 in | Wt 133.2 lb

## 2021-03-11 DIAGNOSIS — K219 Gastro-esophageal reflux disease without esophagitis: Secondary | ICD-10-CM

## 2021-03-11 DIAGNOSIS — M7918 Myalgia, other site: Secondary | ICD-10-CM

## 2021-03-11 DIAGNOSIS — E559 Vitamin D deficiency, unspecified: Secondary | ICD-10-CM | POA: Diagnosis not present

## 2021-03-11 NOTE — Progress Notes (Signed)
[facility] [address] [place] [address] Dept: [phone] Dept Fax: [phone]  Transfer of Care Office Visit  Subjective:    Patient ID: Abigail Choi, female    DOB: 07-18-1970, 50 y.o..   MRN: [id number]  Chief Complaint  Patient presents with   Transitions Of Care    TOC from Dr. Barron Alvine, no concerns.     History of Present Illness:  Patient is in today to establish care. Abigail Choi was born in [place] and grew up in [place], [place]. She attended college at [employer], [place], majoring in business. 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 She is married and has 3 daughters (22, 26, 75). She denies tobacco or drug use and only drinks alcohol on occasions.  Abigail Choi has a history of a prior right great toe fracture that required pinning to allow to heal. She notes that since that time, she has developed stiffness in the right 1st IP joint.  Abigail Choi has a history of GERD. She notes that she only occasionally has issues with this and manages this without medication in general.  Abigail Choi has a history fo a Vitamin D deficiency. She is currently on maintenance therapy with an OTC Vit D supplement.  Abigail Choi has a history of various muscle aches. This effects her chest and neck/shoulder area. She uses occasional Flexeril for managing discomfort.  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   Chest pain 11/16/2014   Chronic headache disorder  10/13/2013   Past Surgical History:  Procedure Laterality Date   TOE SURGERY Right 11/2020   RIGHT great toe   WISDOM TOOTH EXTRACTION     Family History  Problem Relation Age of Onset   Colon polyps Mother 70   Hypertension Father    Healthy Daughter    Healthy Daughter    Healthy Daughter    Cancer Paternal Uncle        Glioblastoma   Heart disease Maternal Grandmother 50   Diabetes Maternal Grandmother    Heart disease Maternal Grandfather    Cancer Paternal Grandmother        Glioblastoma   Diabetes Paternal Grandfather    Colon cancer Neg Hx    Esophageal cancer Neg Hx    Stomach cancer Neg Hx    Rectal cancer Neg Hx    Outpatient Medications Prior to Visit  Medication Sig Dispense Refill   cyclobenzaprine (FLEXERIL) 5 MG tablet Take 1-2 tablets (5-10 mg total) by mouth at bedtime. 30 tablet 0   levonorgestrel (MIRENA) 20 MCG/24HR IUD 1 each by Intrauterine route once.     VITAMIN D PO Take 1 tablet by mouth daily at 6 (six) AM.     Facility-Administered Medications Prior to Visit  Medication Dose Route Frequency Provider Last Rate Last Admin   0.9 %  sodium chloride infusion  500 mL Intravenous Once Nandigam, Eleonore Chiquito, MD       No Known Allergies    Objective:   Today's Vitals   03/11/21 1108  BP:  102/68  Pulse: 82  Temp: (!) 97.2 F (36.2 C)  TempSrc: Temporal  SpO2: 97%  Weight: 133 lb 3.2 oz (60.4 kg)  Height: 5\' 7"  (1.702 m)   Body mass index is 20.86 kg/m.   General: Well developed, well nourished. No acute distress. Psych: Alert and oriented. Normal mood and affect.  Health Maintenance Due  Topic Date Due   HIV Screening  Never done   Hepatitis C Screening  Never done   Lab Results BMP Latest Ref Rng & Units 07/06/2020 03/29/2018 02/09/2018  Glucose 70 - 99 mg/dL 82 94 93  BUN 6 - 23 mg/dL 16 16 12   Creatinine 0.40 - 1.20 mg/dL 0.75 0.88 0.91  Sodium 135 - 145 mEq/L 141 139 141  Potassium 3.5 - 5.1 mEq/L 4.3 3.9 4.3  Chloride 96 - 112  mEq/L 105 102 104  CO2 19 - 32 mEq/L 30 28 30   Calcium 8.4 - 10.5 mg/dL 9.9 9.7 10.5   Lab Results  Component Value Date   CHOL 165 07/06/2020   HDL 66.00 07/06/2020   LDLCALC 90 07/06/2020   TRIG 46.0 07/06/2020   CHOLHDL 3 07/06/2020   CBC Latest Ref Rng & Units 07/06/2020 06/01/2020 02/09/2018  WBC 4.0 - 10.5 K/uL 3.9(L) 4.8 4.0  Hemoglobin 12.0 - 15.0 g/dL 12.5 12.7 13.0  Hematocrit 36.0 - 46.0 % 35.6(L) 36.7 37.9  Platelets 150.0 - 400.0 K/uL 170.0 175.0 175.0   Component Ref Range & Units 8 mo ago 5 yr ago  VITD 30.00 - 100.00 ng/mL 26.77 Low   21.91 Low      Assessment & Plan:   1. Gastroesophageal reflux disease, unspecified whether esophagitis present Abigail Choi will continue to manage this as needed. I agree with avoiding daily medicationif possible.  2. Vitamin D deficiency Abigail Choi's last Vitamin D remained mildly low. I recommend we reassess this at her next visit and consider if she needs another course of Vitamin D at a replacement dose.  3. Myofascial pain Abigail Choi admits to various muscular pain issues. I am okay with her continuing to use an occasional Flexeril. I also advised her to consider routine massage as a way of reducing overall sore muscles.  Haydee Salter, MD

## 2021-07-02 ENCOUNTER — Encounter: Payer: Self-pay | Admitting: Family Medicine

## 2021-07-02 DIAGNOSIS — Z01419 Encounter for gynecological examination (general) (routine) without abnormal findings: Secondary | ICD-10-CM | POA: Diagnosis not present

## 2021-07-02 DIAGNOSIS — Z6820 Body mass index (BMI) 20.0-20.9, adult: Secondary | ICD-10-CM | POA: Diagnosis not present

## 2021-07-02 DIAGNOSIS — Z1231 Encounter for screening mammogram for malignant neoplasm of breast: Secondary | ICD-10-CM | POA: Diagnosis not present

## 2021-07-02 LAB — HM MAMMOGRAPHY

## 2021-09-09 ENCOUNTER — Ambulatory Visit (INDEPENDENT_AMBULATORY_CARE_PROVIDER_SITE_OTHER): Payer: BC Managed Care – PPO | Admitting: Family Medicine

## 2021-09-09 VITALS — BP 114/64 | HR 74 | Temp 97.6°F | Ht 67.0 in | Wt 133.4 lb

## 2021-09-09 DIAGNOSIS — Z1159 Encounter for screening for other viral diseases: Secondary | ICD-10-CM | POA: Diagnosis not present

## 2021-09-09 DIAGNOSIS — Z0000 Encounter for general adult medical examination without abnormal findings: Secondary | ICD-10-CM

## 2021-09-09 DIAGNOSIS — E559 Vitamin D deficiency, unspecified: Secondary | ICD-10-CM | POA: Diagnosis not present

## 2021-09-09 DIAGNOSIS — Z862 Personal history of diseases of the blood and blood-forming organs and certain disorders involving the immune mechanism: Secondary | ICD-10-CM | POA: Diagnosis not present

## 2021-09-09 LAB — CBC
HCT: 34.5 % — ABNORMAL LOW (ref 36.0–46.0)
Hemoglobin: 12.1 g/dL (ref 12.0–15.0)
MCHC: 35.1 g/dL (ref 30.0–36.0)
MCV: 92.5 fl (ref 78.0–100.0)
Platelets: 164 10*3/uL (ref 150.0–400.0)
RBC: 3.73 Mil/uL — ABNORMAL LOW (ref 3.87–5.11)
RDW: 12.6 % (ref 11.5–15.5)
WBC: 3.7 10*3/uL — ABNORMAL LOW (ref 4.0–10.5)

## 2021-09-09 LAB — VITAMIN D 25 HYDROXY (VIT D DEFICIENCY, FRACTURES): VITD: 23.14 ng/mL — ABNORMAL LOW (ref 30.00–100.00)

## 2021-09-09 NOTE — Progress Notes (Signed)
?[facility] ?[facility] ?[place] ?[place] [address] ?Dept: [phone] ?Dept Fax: [phone] ? ?Annual Physical Visit ? ?Subjective:  ? ? Patient ID: Abigail Choi, female    DOB: 01/12/1971, 51 y.o..   MRN: [id number] ? ?Chief Complaint  ?Patient presents with  ? Annual Exam  ?  CPE/labs.  Not fasting (coffee with creamer).  No concerns.   ? ? ?History of Present Illness: ? ?Patient is in today for an annual physical/preventative visit. ? ?Abigail Choi notes that she is in good health. She is keeping up with women's health screenings through Dr. Harrington Challenger (GYN). She currently uses a Mirena IUD. She has not been having menses, but also had not noted any menopausal symptoms. ? ?Review of Systems  ?Constitutional:  Negative for chills, fever, malaise/fatigue and weight loss.  ?HENT:  Negative for congestion, ear discharge, ear pain, hearing loss, sinus pain, sore throat and tinnitus.   ?Eyes:  Negative for blurred vision, pain, discharge and redness.  ?Respiratory:  Negative for cough, sputum production, shortness of breath and wheezing.   ?Cardiovascular:  Negative for chest pain, palpitations and leg swelling.  ?Gastrointestinal:  Negative for abdominal pain, constipation, diarrhea, heartburn, nausea and vomiting.  ?Musculoskeletal:  Negative for back pain, joint pain, myalgias and neck pain.  ?Skin:  Negative for itching and rash.  ?Neurological:  Negative for focal weakness, weakness and headaches.  ?Endo/Heme/Allergies:  Negative for environmental allergies.  ?Psychiatric/Behavioral:  Negative for depression. The patient is not nervous/anxious.   ? ?Past Medical History: ?Patient Active Problem List  ? Diagnosis Date Noted  ? Diverticulosis 12/26/2020  ? Hemorrhoids 12/26/2020  ? Vitamin D deficiency 07/06/2020  ? History of COVID-19 02/21/2020  ? Myofascial pain 06/08/2018  ? Primary insomnia 06/08/2018  ? GERD (gastroesophageal reflux disease) 02/09/2018  ?  Ruptured varicose vein 05/27/2017  ? 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 Chest pain 11/16/2014  ? Chronic headache disorder 10/13/2013  ? ?Past Surgical History:  ?Procedure Laterality Date  ? TOE SURGERY Right 11/2020  ? RIGHT great toe  ? WISDOM TOOTH EXTRACTION    ? ?Family History  ?Problem Relation Age of Onset  ? Colon polyps Mother 55  ? Hypertension Father   ? Healthy Daughter   ? Healthy Daughter   ? Healthy Daughter   ? Cancer Paternal Uncle   ?     Glioblastoma  ? Heart disease Maternal Grandmother 13  ? Diabetes Maternal Grandmother   ? Heart disease Maternal Grandfather   ? Cancer Paternal Grandmother   ?     Glioblastoma  ? Diabetes Paternal Grandfather   ? Colon cancer Neg Hx   ? Esophageal cancer Neg Hx   ? Stomach cancer Neg Hx   ? Rectal cancer Neg Hx   ? ?Outpatient Medications Prior to Visit  ?Medication Sig Dispense Refill  ? cyclobenzaprine (FLEXERIL) 5 MG tablet Take 1-2 tablets (5-10 mg total) by mouth at bedtime. 30 tablet 0  ? levonorgestrel (MIRENA) 20 MCG/24HR IUD 1 each by Intrauterine route once.    ? VITAMIN D PO Take 1 tablet by mouth daily at 6 (six) AM. (Patient not taking: Reported on 09/09/2021)    ? ?Facility-Administered Medications Prior to Visit  ?Medication Dose Route Frequency Provider Last Rate Last Admin  ? 0.9 %  sodium chloride infusion  500 mL Intravenous Once Nandigam, Venia Minks, MD      ? ?No Known Allergies ?   ?Objective:  ? ?  Today's Vitals  ? 09/09/21 [id number]  ?BP: 114/64  ?Pulse: 74  ?Temp: 97.6 ?F (36.4 ?C)  ?TempSrc: Temporal  ?SpO2: 98%  ?Weight: 133 lb 6.4 oz (60.5 kg)  ?Height: 5\' 7"  (1.702 m)  ? ?Body mass index is 20.89 kg/m?.  ? ?General: Well developed, well nourished. No acute distress. ?HEENT: Normocephalic, non-traumatic. PERRL, EOMI. Conjunctiva clear. Fundiscopic exam shows  ? normal disc and vasculature. External ears normal. EAC and TMs normal bilaterally. Nose   ? clear without congestion or rhinorrhea. Mucous membranes  moist. Oropharynx clear. Good dentition. ?Neck: Supple. No lymphadenopathy. No thyromegaly. ?Lungs: Clear to auscultation bilaterally. No wheezing, rales or rhonchi. ?CV: RRR without murmurs or rubs. Pulses 2+ bilaterally. ?Abdomen: Soft, non-tender. Bowel sounds positive, normal pitch and frequency. No  ? hepatosplenomegaly. No rebound or guarding. ?Extremities: Full ROM. No joint swelling or tenderness. No edema noted. ?Skin: Warm and dry. No rashes. ?Psych: Alert and oriented. Normal mood and affect. ? ?Health Maintenance Due  ?Topic Date Due  ? HIV Screening  Never done  ? Hepatitis C Screening  Never done  ? Zoster Vaccines- Shingrix (1 of 2) Never done  ?   ?Assessment & Plan:  ? ?1. Annual physical exam ?Overall excellent health. Discussed recommended health screenings. I recommend getting 150 minutes of moderate-intensity exercise each week. ? ?2. Vitamin D deficiency ?Last Vitamin D level remained low. We will repeat this today. ? ?- VITAMIN D 25 Hydroxy (Vit-D Deficiency, Fractures) ? ?3. History of anemia ?Past mildly low RBC and Hct. I will repeat CBC. This may have improved now that she is on Mirena. ? ?- CBC ? ?4. Encounter for hepatitis C screening test for low risk patient ? ?- HCV Ab w Reflex to Quant PCR ? ? ?Return in about 1 year (around 09/10/2022) for Annual preventative care.  ? ?Haydee Salter, MD ?

## 2021-09-10 LAB — HCV INTERPRETATION

## 2021-09-10 LAB — HCV AB W REFLEX TO QUANT PCR: HCV Ab: NONREACTIVE

## 2022-01-16 DIAGNOSIS — D225 Melanocytic nevi of trunk: Secondary | ICD-10-CM | POA: Diagnosis not present

## 2022-01-16 DIAGNOSIS — L821 Other seborrheic keratosis: Secondary | ICD-10-CM | POA: Diagnosis not present

## 2022-01-16 DIAGNOSIS — L570 Actinic keratosis: Secondary | ICD-10-CM | POA: Diagnosis not present

## 2022-01-16 DIAGNOSIS — L578 Other skin changes due to chronic exposure to nonionizing radiation: Secondary | ICD-10-CM | POA: Diagnosis not present

## 2022-06-09 DIAGNOSIS — B342 Coronavirus infection, unspecified: Secondary | ICD-10-CM | POA: Diagnosis not present

## 2022-07-14 DIAGNOSIS — H524 Presbyopia: Secondary | ICD-10-CM | POA: Diagnosis not present

## 2022-07-21 ENCOUNTER — Encounter: Payer: Self-pay | Admitting: Family Medicine

## 2022-07-21 DIAGNOSIS — Z6820 Body mass index (BMI) 20.0-20.9, adult: Secondary | ICD-10-CM | POA: Diagnosis not present

## 2022-07-21 DIAGNOSIS — Z1231 Encounter for screening mammogram for malignant neoplasm of breast: Secondary | ICD-10-CM | POA: Diagnosis not present

## 2022-07-21 DIAGNOSIS — Z01419 Encounter for gynecological examination (general) (routine) without abnormal findings: Secondary | ICD-10-CM | POA: Diagnosis not present

## 2022-07-21 LAB — HM MAMMOGRAPHY

## 2022-10-06 ENCOUNTER — Encounter: Payer: Self-pay | Admitting: Family Medicine

## 2022-10-06 ENCOUNTER — Ambulatory Visit (INDEPENDENT_AMBULATORY_CARE_PROVIDER_SITE_OTHER): Payer: BC Managed Care – PPO | Admitting: Family Medicine

## 2022-10-06 VITALS — BP 118/66 | HR 67 | Temp 98.1°F | Ht 67.0 in | Wt 133.8 lb

## 2022-10-06 DIAGNOSIS — E559 Vitamin D deficiency, unspecified: Secondary | ICD-10-CM | POA: Diagnosis not present

## 2022-10-06 DIAGNOSIS — Z0000 Encounter for general adult medical examination without abnormal findings: Secondary | ICD-10-CM

## 2022-10-06 DIAGNOSIS — Z862 Personal history of diseases of the blood and blood-forming organs and certain disorders involving the immune mechanism: Secondary | ICD-10-CM | POA: Diagnosis not present

## 2022-10-06 LAB — CBC
HCT: 35.5 % — ABNORMAL LOW (ref 36.0–46.0)
Hemoglobin: 12.1 g/dL (ref 12.0–15.0)
MCHC: 33.9 g/dL (ref 30.0–36.0)
MCV: 93.1 fl (ref 78.0–100.0)
Platelets: 157 10*3/uL (ref 150.0–400.0)
RBC: 3.82 Mil/uL — ABNORMAL LOW (ref 3.87–5.11)
RDW: 12.3 % (ref 11.5–15.5)
WBC: 3.3 10*3/uL — ABNORMAL LOW (ref 4.0–10.5)

## 2022-10-06 LAB — VITAMIN D 25 HYDROXY (VIT D DEFICIENCY, FRACTURES): VITD: 25.79 ng/mL — ABNORMAL LOW (ref 30.00–100.00)

## 2022-10-06 NOTE — Assessment & Plan Note (Signed)
Ms. Nicklin has had mild microcytic anemia in the past. I will reassess today and check iron levels.

## 2022-10-06 NOTE — Progress Notes (Signed)
[facility] [address] [place] [address] Dept: [phone] Dept Fax: [phone]  Annual Physical Visit  Subjective:    Patient ID: Abigail Choi, female    DOB: 08/29/1970, 52 y.o..   MRN: [id number]  Chief Complaint  Patient presents with   Annual Exam    CPE/labs.  Fasting today.  No concerns.    History of Present Illness:  Patient is in today for an annual physical/preventative visit.  Review of Systems  Constitutional:  Negative for chills, diaphoresis, fever, malaise/fatigue and weight loss.  HENT:  Negative for congestion, ear pain, hearing loss, sinus pain, sore throat and tinnitus.   Eyes:  Negative for blurred vision, pain, discharge and redness.  Respiratory:  Negative for cough, shortness of breath and wheezing.   Cardiovascular:  Negative for chest pain and palpitations.  Gastrointestinal:  Positive for abdominal pain. Negative for constipation, diarrhea, heartburn, nausea and vomiting.       Has periodic episodes of right abdominal cramping/discomfort. She notes a past history of gallbladder dysfunction, but nothing that needed surgery. She feels her diet at times contributes. She has noted worse issues when eating red meat.  Musculoskeletal:  Negative for back pain, joint pain and myalgias.  Skin:  Negative for itching and rash.  Psychiatric/Behavioral:  Negative for depression. The patient is not nervous/anxious.    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   Chest pain 11/16/2014   Past Surgical  History:  Procedure Laterality Date   TOE SURGERY Right 11/2020   RIGHT great toe   WISDOM TOOTH EXTRACTION     Family History  Problem Relation Age of Onset   Colon polyps Mother 70   Hypertension Father    Healthy Daughter    Healthy Daughter    Healthy Daughter    Cancer Paternal Uncle        Glioblastoma   Heart disease Maternal Grandmother 50   Diabetes Maternal Grandmother    Heart disease Maternal Grandfather    Cancer Paternal Grandmother        Glioblastoma   Diabetes Paternal Grandfather    Colon cancer Neg Hx    Esophageal cancer Neg Hx    Stomach cancer Neg Hx    Rectal cancer Neg Hx    Outpatient Medications Prior to Visit  Medication Sig Dispense Refill   levonorgestrel (MIRENA) 20 MCG/24HR IUD 1 each by Intrauterine route once.     cyclobenzaprine (FLEXERIL) 5 MG tablet Take 1-2 tablets (5-10 mg total) by mouth at bedtime. 30 tablet 0   Facility-Administered Medications Prior to Visit  Medication Dose Route Frequency Provider Last Rate Last Admin   0.9 %  sodium chloride infusion  500 mL Intravenous Once Nandigam, Eleonore Chiquito, MD       No Known Allergies Objective:   Today's Vitals   10/06/22 0858  BP: 118/66  Pulse: 67  Temp: 98.1 F (36.7 C)  TempSrc: Temporal  SpO2: 98%  Weight: 133 lb 12.8 oz (60.7 kg)  Height: 5\' 7"  (1.702 m)   Body mass index is 20.96 kg/m.   General: Well  developed, well nourished. No acute distress. HEENT: Normocephalic, non-traumatic. PERRL, EOMI. Conjunctiva clear. External ears normal. EAC   and TMs normal bilaterally. Nose clear without congestion or rhinorrhea. Mucous membranes moist.   Oropharynx clear. Good dentition. Neck: Supple. No lymphadenopathy. No thyromegaly. Lungs: Clear to auscultation bilaterally. No wheezing, rales or rhonchi. CV: RRR without murmurs or rubs. Pulses 2+ bilaterally. Abdomen: Soft, non-tender. Bowel sounds positive, normal pitch and frequency. No   hepatosplenomegaly. No rebound or  guarding. Back: Straight. No CVA tenderness bilaterally. Extremities: Full ROM. No joint swelling or tenderness. No edema noted. Skin: Warm and dry. No rashes. Psych: Alert and oriented. Normal mood and affect.  Health Maintenance Due  Topic Date Due   HIV Screening  Never done   Zoster Vaccines- Shingrix (1 of 2) Never done     Assessment & Plan:   Problem List Items Addressed This Visit       Other   Vitamin D insufficiency    Past Vitamin D levels have been mildly low. She notes she has not been consistent with taking a supplement. We will reassess this today.      Relevant Orders   VITAMIN D 25 Hydroxy (Vit-D Deficiency, Fractures)   History of anemia    Ms. Marchel has had mild microcytic anemia in the past. I will reassess today and check iron levels.      Relevant Orders   CBC   Iron, TIBC and Ferritin Panel   Other Visit Diagnoses     Annual physical exam    -  Primary   Overall excellent  health. Encourage regular exercise. Discussed recommended screenings and immunizations.       Return in about 1 year (around 10/06/2023) for Annual preventative care.   Loyola Mast, MD

## 2022-10-06 NOTE — Assessment & Plan Note (Signed)
Past Vitamin D levels have been mildly low. She notes she has not been consistent with taking a supplement. We will reassess this today.

## 2022-10-07 LAB — IRON,TIBC AND FERRITIN PANEL
%SAT: 32 % (calc) (ref 16–45)
Ferritin: 33 ng/mL (ref 16–232)
Iron: 85 ug/dL (ref 45–160)
TIBC: 262 mcg/dL (calc) (ref 250–450)

## 2022-11-13 ENCOUNTER — Ambulatory Visit: Payer: BC Managed Care – PPO | Admitting: Internal Medicine

## 2022-11-13 ENCOUNTER — Encounter: Payer: Self-pay | Admitting: Internal Medicine

## 2022-11-13 VITALS — BP 100/68 | HR 74 | Temp 98.8°F | Wt 134.2 lb

## 2022-11-13 DIAGNOSIS — J029 Acute pharyngitis, unspecified: Secondary | ICD-10-CM

## 2022-11-13 LAB — POCT INFLUENZA A/B
Influenza A, POC: NEGATIVE
Influenza B, POC: NEGATIVE

## 2022-11-13 LAB — POCT RAPID STREP A (OFFICE): Rapid Strep A Screen: POSITIVE — AB

## 2022-11-13 LAB — POC COVID19 BINAXNOW: SARS Coronavirus 2 Ag: NEGATIVE

## 2022-11-13 MED ORDER — LIDOCAINE VISCOUS HCL 2 % MT SOLN: 15.0000 mL | OROMUCOSAL | 0 refills | Status: DC | PRN

## 2022-11-13 MED ORDER — PENICILLIN V POTASSIUM 500 MG PO TABS: 500.0000 mg | ORAL_TABLET | Freq: Two times a day (BID) | ORAL | 0 refills | Status: AC

## 2022-11-13 NOTE — Progress Notes (Signed)
[facility] [address] [place] [address] Dept: [phone] Dept Fax: [phone]  Acute Care Office Visit  Subjective:   Abigail Choi 12-16-1970 11/13/2022  Chief Complaint  Patient presents with   Sore Throat    Started sunday night     HPI: Discussed the use of AI scribe software for clinical note transcription with the patient, who gave verbal consent to proceed.  History of Present Illness   The patient, with a busy schedule due to their daughter's upcoming wedding, presents with a sore throat and earache that started on Sunday. Initially, they thought they were getting a cold due to a scratchy throat, but the sore throat have progressively worsened over the week. Patient reports "like swallowing razors."  They do have a slight-minimal congestion and a dry cough. The earache is only on the left side and they also have pain in the left side of their neck. They have been taking Mucinex, Motrin, and Tylenol, but these have not provided relief. They deny having a fever but did feel a little chilled in the morning. They also mention feeling exhausted and having a headache, which they attribute to stress.       The following portions of the patient's history were reviewed and updated as appropriate: past medical history, past surgical history, family history, social history, allergies, medications, and problem list.   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   Chest pain 11/16/2014   Past Medical History:  Diagnosis Date   Frequent  headaches    PONV (postoperative nausea and vomiting)    RUQ abdominal pain    Vitamin D deficiency    Past Surgical History:  Procedure Laterality Date   TOE SURGERY Right 11/2020   RIGHT great toe   WISDOM TOOTH EXTRACTION     Family History  Problem Relation Age of Onset   Colon polyps Mother 63   Hypertension Father    Healthy Daughter    Healthy Daughter    Healthy Daughter    Cancer Paternal Uncle        Glioblastoma   Heart disease Maternal Grandmother 50   Diabetes Maternal Grandmother    Heart disease Maternal Grandfather    Cancer Paternal Grandmother        Glioblastoma   Diabetes Paternal Grandfather    Colon cancer Neg Hx    Esophageal cancer Neg Hx    Stomach cancer Neg Hx    Rectal cancer Neg Hx    Outpatient Medications Prior to Visit  Medication Sig Dispense Refill   levonorgestrel (MIRENA) 20 MCG/24HR IUD 1 each by Intrauterine route once.     Facility-Administered Medications Prior to Visit  Medication Dose Route Frequency Provider Last Rate Last Admin   0.9 %  sodium chloride infusion  500 mL Intravenous Once Nandigam, Kavitha V, MD       No Known Allergies   ROS: A complete ROS was performed with pertinent positives/negatives noted in the HPI. The remainder of the ROS are negative.    Objective:   Today's Vitals   11/13/22 1026  BP: 100/68  Pulse:  74  Temp: 98.8 F (37.1 C)  TempSrc: Oral  SpO2: 99%  Weight: 134 lb 3.2 oz (60.9 kg)     GENERAL: Well-appearing, in NAD. Well nourished.  SKIN: Pink, warm and dry. No rash, lesion, ulceration, or ecchymoses.  HEENT:    HEAD: Normocephalic, non-traumatic.  EYES: Conjunctive pink without exudate. PERRL, EOMI.  EARS: External ear w/o redness, swelling, masses, or lesions. EAC clear. TM's intact, translucent w/o bulging, appropriate landmarks visualized.  NOSE: Septum midline w/o deformity. Nares patent, mucosa pink and non-inflamed w/o drainage. No sinus tenderness.  THROAT: Uvula midline.  Oropharynx erythematous. Tonsils inflamed w/o exudate. Mucus membranes pink and moist.  NECK: Trachea midline. Full ROM w/o pain or tenderness. No lymphadenopathy.  RESPIRATORY: Chest wall symmetrical. Respirations even and non-labored. Breath sounds clear to auscultation bilaterally.  CARDIAC: S1, S2 present, regular rate and rhythm. Peripheral pulses 2+ bilaterally.  MSK: Muscle tone and strength appropriate for age.  EXTREMITIES: Without clubbing, cyanosis, or edema.  NEUROLOGIC: Steady, even gait.  PSYCH/MENTAL STATUS: Alert, oriented x 3. Cooperative, appropriate mood and affect.    Results for orders placed or performed in visit on 11/13/22  POCT rapid strep A  Result Value Ref Range   Rapid Strep A Screen Positive (A) Negative  POCT Influenza A/B  Result Value Ref Range   Influenza A, POC Negative Negative   Influenza B, POC Negative Negative  POC COVID-19 BinaxNow  Result Value Ref Range   SARS Coronavirus 2 Ag Negative Negative      Assessment & Plan:  Assessment and Plan    Strep Pharyngitis: Positive rapid strep test.  -Prescribe Penicillin VK 500mg  BID for 10 days. -Prescribe viscous lidocaine for symptomatic relief as needed. -Advise to alternate Tylenol and ibuprofen for pain. -Recommend saltwater gargles as needed. - change toothbrush after starting ABX  -Follow up if symptoms worsen or fail to improve.      Meds ordered this encounter  Medications   penicillin v potassium (VEETID) 500 MG tablet    Sig: Take 1 tablet (500 mg total) by mouth 2 (two) times daily for 10 days.    Dispense:  20 tablet    Refill:  0    Order Specific Question:   Supervising Provider    Answer:   Garnette Gunner [[id number]]   lidocaine (XYLOCAINE) 2 % solution    Sig: Use as directed 15 mLs in the mouth or throat every 4 (four) hours as needed for mouth pain.    Dispense:  100 mL    Refill:  0    Cherry flavoring if possible    Order Specific Question:   Supervising Provider     Answer:   Garnette Gunner [[id number]]   Lab Orders         POCT rapid strep A         POCT Influenza A/B         POC COVID-19 BinaxNow     No images are attached to the encounter or orders placed in the encounter.  Return if symptoms worsen or fail to improve.   Of note, portions of this note may have been created with voice recognition software [employer]icist, medical). While this note has been edited for accuracy, occasional wrong-word or 'sound-a-like' substitutions may have occurred due to the inherent limitations of voice recognition software.   Salvatore Decent, FNP

## 2022-11-13 NOTE — Patient Instructions (Signed)
Tylenol and ibuprofen as needed  Salt water gargles  Change toothbrush

## 2023-02-11 ENCOUNTER — Ambulatory Visit: Payer: BC Managed Care – PPO | Admitting: Family Medicine

## 2023-02-11 ENCOUNTER — Encounter: Payer: Self-pay | Admitting: Family Medicine

## 2023-02-11 VITALS — BP 124/80 | HR 112 | Temp 98.3°F | Ht 67.0 in | Wt 130.0 lb

## 2023-02-11 DIAGNOSIS — R0789 Other chest pain: Secondary | ICD-10-CM

## 2023-02-11 DIAGNOSIS — K219 Gastro-esophageal reflux disease without esophagitis: Secondary | ICD-10-CM | POA: Diagnosis not present

## 2023-02-11 MED ORDER — OMEPRAZOLE 40 MG PO CPDR
40.0000 mg | DELAYED_RELEASE_CAPSULE | Freq: Every day | ORAL | 3 refills | Status: DC
Start: 2023-02-11 — End: 2023-06-19

## 2023-02-11 NOTE — Progress Notes (Signed)
[facility] [address] [place] [address] Dept: [phone] Dept Fax: [phone]  Office Visit  Subjective:    Patient ID: Abigail Choi, female    DOB: 1970-06-08, 52 y.o..   MRN: [id number]  Chief Complaint  Patient presents with   Gastroesophageal Reflux    C/o having acid reflux that causes chest pains also have neck pain.   Has been taking prilosec   History of Present Illness:  Patient is in today for noting intermittent chest pain. She states this has been occurring for about the last 8 years. It seems to flare at times and then go away for a while. she describes the chest issues as a pressure sensation, but at other times it is dull. She notes the pain shifts around. It can be left lower chest, epigastric, retrosternal or right chest wall/back. She ahs ahd priro cardiac work-up including EKGs and a stress test, which were normal. She was treated at one point by Dr. Dayton Martes with a muscle relaxer. At another time, Ms. Nche put her on a PPI. She is not currently taking any medication for this. She denies any associated dyspnea, N/V, or diaphoresis. She has not been able to identify any particular triggers or relieving activities.  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   Chest pain 11/16/2014   Past Surgical History:  Procedure Laterality Date   TOE SURGERY Right 11/2020   RIGHT great toe   WISDOM TOOTH EXTRACTION     Family History  Problem Relation Age of Onset   Colon polyps Mother 50   Hypertension Father     Healthy Daughter    Healthy Daughter    Healthy Daughter    Cancer Paternal Uncle        Glioblastoma   Heart disease Maternal Grandmother 50   Diabetes Maternal Grandmother    Heart disease Maternal Grandfather    Cancer Paternal Grandmother        Glioblastoma   Diabetes Paternal Grandfather    Colon cancer Neg Hx    Esophageal cancer Neg Hx    Stomach cancer Neg Hx    Rectal cancer Neg Hx    Outpatient Medications Prior to Visit  Medication Sig Dispense Refill   levonorgestrel (MIRENA) 20 MCG/24HR IUD 1 each by Intrauterine route once.     omeprazole (PRILOSEC OTC) 20 MG tablet Take 20 mg by mouth daily.     lidocaine (XYLOCAINE) 2 % solution Use as directed 15 mLs in the mouth or throat every 4 (four) hours as needed for mouth pain. 100 mL 0   Facility-Administered Medications Prior to Visit  Medication Dose Route Frequency Provider Last Rate Last Admin   0.9 %  sodium chloride infusion  500 mL Intravenous Once Nandigam, Eleonore Chiquito, MD       No Known Allergies   Objective:   Today's Vitals   02/11/23 1537  BP: 124/80  Pulse: (!) 112  Temp: 98.3 F (36.8 C)  TempSrc: Temporal  SpO2: 99%  Weight: 130 lb (59 kg)  Height: 5\' 7"  (1.702 m)   Body mass  index is 20.36 kg/m.   General: Well developed, well nourished. No acute distress. Lungs: Clear to auscultation bilaterally. No wheezing, rales or rhonchi. CV: RRR without murmurs or rubs. Pulses 2+ bilaterally. Abdomen: Soft. Bowel sounds positive, normal pitch and frequency. No   hepatosplenomegaly. Mild tenderness in the epigastrium just below the xiphoid. Murphy's sign -.   No rebound or guarding. Psych: Alert and oriented. Normal mood and affect.  There are no preventive care reminders to display for this patient.  EKG: Normal sinus rhythm (rate = 94). RSR' in V1 without any evident conduction delay or left   axis deviation. Stable in appearance compared to EKG form 2022.  Assessment & Plan:   Problem List  Items Addressed This Visit       Digestive   GERD (gastroesophageal reflux disease)    I recommend we start her back on a PPI. I will refer her to GI to consider an EGD for more definitive diagnosis.      Relevant Medications   omeprazole (PRILOSEC OTC) 20 MG tablet   omeprazole (PRILOSEC) 40 MG capsule   Other Relevant Orders   Ambulatory referral to Gastroenterology     Other   Chest pain - Primary    Like the prior evaluations, I agree that this seems primarily GI related. I do not see an indication for cardiology referral at this point.      Relevant Orders   EKG 12-Lead (Completed)   Ambulatory referral to Gastroenterology    No follow-ups on file.   Loyola Mast, MD

## 2023-02-11 NOTE — Assessment & Plan Note (Signed)
I recommend we start her back on a PPI. I will refer her to GI to consider an EGD for more definitive diagnosis.

## 2023-02-11 NOTE — Assessment & Plan Note (Signed)
Like the prior evaluations, I agree that this seems primarily GI related. I do not see an indication for cardiology referral at this point.

## 2023-02-13 ENCOUNTER — Ambulatory Visit: Payer: BC Managed Care – PPO | Admitting: Family Medicine

## 2023-02-16 ENCOUNTER — Telehealth: Payer: Self-pay | Admitting: Gastroenterology

## 2023-02-16 NOTE — Telephone Encounter (Signed)
Patient called requesting to speak with a nurse regarding Gerd symptoms, stated there was a referral sent but she also received medication from her PCP. Wants to make sure does is on the right track.

## 2023-02-16 NOTE — Telephone Encounter (Signed)
Patient contacted. She saw her PCP and was given medication for what is assumed to be GERD. She feels it is helping her greatly. She is okay with waiting for the next available. Appointment scheduled for 05/18/23 at 11:20 am. She has my name and will call if she acutely worsens or has concerns.

## 2023-05-09 ENCOUNTER — Other Ambulatory Visit: Payer: Self-pay | Admitting: Family Medicine

## 2023-05-09 DIAGNOSIS — K219 Gastro-esophageal reflux disease without esophagitis: Secondary | ICD-10-CM

## 2023-05-18 ENCOUNTER — Ambulatory Visit: Payer: BC Managed Care – PPO | Admitting: Gastroenterology

## 2023-05-18 ENCOUNTER — Encounter: Payer: Self-pay | Admitting: Gastroenterology

## 2023-05-18 VITALS — BP 110/70 | HR 84 | Ht 67.0 in | Wt 134.0 lb

## 2023-05-18 DIAGNOSIS — R1011 Right upper quadrant pain: Secondary | ICD-10-CM | POA: Diagnosis not present

## 2023-05-18 DIAGNOSIS — M549 Dorsalgia, unspecified: Secondary | ICD-10-CM | POA: Diagnosis not present

## 2023-05-18 DIAGNOSIS — R0789 Other chest pain: Secondary | ICD-10-CM | POA: Diagnosis not present

## 2023-05-18 DIAGNOSIS — K219 Gastro-esophageal reflux disease without esophagitis: Secondary | ICD-10-CM

## 2023-05-18 DIAGNOSIS — G8929 Other chronic pain: Secondary | ICD-10-CM

## 2023-05-18 NOTE — Patient Instructions (Addendum)
 VISIT SUMMARY:  The BravoT capsule test is a noninvasive test for evaluating heartburn or reflux symptoms related to gastroesophageal reflux disease (GERD). Damage caused by GERD can lead to more serious medical problems such as difficulty swallowing (dysphagia), narrowing of the esophagus (strictures), and Barrett's esophagus.  This test involves inserting a capsule the size of a long gel cap into the esophagus to measure the pH environment. Higher levels of pH in the esophagus indicate the presence of acid reflux. Your doctor will analyze results from the Bravo test to determine what is causing your symptoms and which treatment to prescribe for you.  Patients with pacemakers, cardiac defibrillators, or diagnosed gastrointestinal obstructions or strictures should inform your physician.   Throughout the test period, which lasts 48 to 72 hours, the Bravo capsule will measure the pH in your esophagus and transmit this information to the Bravo reflux recorder, a small device that you will wear on your belt or waistband as you would a mobile phone. The capsule communicates with the recorder wirelessly, meaning that no tube or wire remains in your nose or throat.  The Bravo capsule not only measures the degree of acidity during the test period but also how often stomach acid flows into the lower esophagus.  After the capsule has been placed, you can  go about your normal activities. Some patients can feel the presence of the capsule, some do not. You will also be given a diary to note when you have reflux symptoms, when you eat and drink, and when you sleep or lie down.  Once the test has been completed, you will return the recorder and diary to the [facility]. The data is then downloaded to a computer program, which provides a comprehensive report. Your GI doctor will analyze the information and your symptoms to determining if you have acid reflux.  A day or two after the test is completed,  the disposable capsule falls off the wall of your esophagus, harmlessly passes through your digestive tract, and is eliminated from your body through a bowel movement.  You should expect to receive the results of the Bravo study in approximately 2 weeks from returning the recorder.    For 7 days prior to your test do not take: Dexilant, Prevacid, Nexium, Protonix , Aciphex, Zegerid, Pantoprazole , Prilosec or Omeprazole .  For 7 days prior to your test, do not take: Reglan, Tagamet, Zantac , Axid or Pepcid .  You MAY use an antacid such as Rolaids or Tums up to 12 hours prior to your test.   During today's visit, we discussed your ongoing chest discomfort and abdominal pain. We reviewed your symptoms and current medication use, and we have planned further tests to better understand the cause of your discomfort.  YOUR PLAN:  -CHEST DISCOMFORT: Your chest discomfort, which is chronic and intermittent, does not seem to be related to physical activity or meals. You will continue taking Omeprazole  40mg  daily until one week before your scheduled endoscopy on 06/19/2023. This medication helps reduce stomach acid, which may be contributing to your symptoms. During the endoscopy, we may also perform a BRAVO test to measure acid reflux if no clear signs of damage are seen.  -ABDOMINAL PAIN: You have been experiencing discomfort in your right upper abdomen and back. To investigate this further, we have ordered a complete abdominal ultrasound to check for possible gallstones, kidney stones, or other abdominal issues.  INSTRUCTIONS:  Please continue taking Omeprazole  40mg  daily until one week before your endoscopy on 06/19/2023.  Attend the endoscopy appointment on 06/19/2023, and follow up with us  for the results and further management after all tests are completed.  You will be contacted by [facility] Scheduling in the next 2 days to arrange a Ultrasound complete.  The number on your caller ID will be  [phone], please answer when they call.  If you have not heard from them in 2 days please call [phone] to schedule.    You have been scheduled for an endoscopy/Bravo. Please follow written instructions given to you at your visit today.  If you use inhalers (even only as needed), please bring them with you on the day of your procedure.  If you take any of the following medications, they will need to be adjusted prior to your procedure:   STOP Omeprazole  1 week before procedure   DO NOT TAKE 7 DAYS PRIOR TO TEST- Trulicity (dulaglutide) Ozempic, Wegovy (semaglutide) Mounjaro (tirzepatide) Bydureon Bcise (exanatide extended release)  DO NOT TAKE 1 DAY PRIOR TO YOUR TEST Rybelsus (semaglutide) Adlyxin (lixisenatide) Victoza (liraglutide) Byetta (exanatide) ___________________________________________________________________________   I appreciate the  opportunity to care for you  Thank You   Kavitha Nandigam , MD

## 2023-05-18 NOTE — Progress Notes (Signed)
 Abigail Choi    [id number]    1970-10-25  Primary Care Physician:Rudd, Garnette HERO, MD  Referring Physician: Thedora Garnette HERO, MD [address] [place],  [place] [address]   Chief complaint:  Atypical chest pain, GERD  Discussed the use of AI scribe software for clinical note transcription with the patient, who gave verbal consent to proceed.  History of Present Illness   52 yr very pleasant female, with a history of intermittent chest discomfort for several years, presents with a recent flare-up of symptoms. The discomfort is described as a dull ache, primarily located in the back, but occasionally radiating to the right side. The pain is not constant, but rather comes in spells, with periods of relief in between. The patient denies any correlation with physical exertion, meals, or specific activities.  The discomfort is sometimes perceived as a band-like pressure across the chest, and at times, it is felt in the back. The patient reports no excessive belching, but notes that pressure relief may occur with burping. The patient has been on omeprazole  40mg  for approximately three months, with a possible slight improvement in symptoms. However, the patient admits to inconsistent use of the medication, often only taking it when the discomfort is severe.  The patient denies any impact on breathing during these episodes of discomfort. The patient also denies any significant weight changes. Despite the discomfort, the patient maintains an active lifestyle, including regular workouts, without exacerbation of symptoms. The patient expresses concern about the recurring discomfort and is seeking answers to alleviate the worry of a potential heart condition.     Colonoscopy 12/24/20 - Diverticulosis in the sigmoid colon. - Non- bleeding external and internal hemorrhoids.     Outpatient Encounter Medications as of 05/18/2023  Medication Sig   omeprazole  (PRILOSEC) 40 MG capsule  Take 1 capsule (40 mg total) by mouth daily.   levonorgestrel  (MIRENA ) 20 MCG/24HR IUD 1 each by Intrauterine route once. (Patient not taking: Reported on 05/18/2023)   [DISCONTINUED] omeprazole  (PRILOSEC OTC) 20 MG tablet Take 20 mg by mouth daily. (Patient not taking: Reported on 05/18/2023)   Facility-Administered Encounter Medications as of 05/18/2023  Medication   0.9 %  sodium chloride  infusion    Allergies as of 05/18/2023   (No Known Allergies)    Past Medical History:  Diagnosis Date   Frequent headaches    PONV (postoperative nausea and vomiting)    RUQ abdominal pain    Vitamin D  deficiency     Past Surgical History:  Procedure Laterality Date   TOE SURGERY Right 11/2020   RIGHT great toe   WISDOM TOOTH EXTRACTION      Family History  Problem Relation Age of Onset   Colon polyps Mother 59   Hypertension Father    Healthy Daughter    Healthy Daughter    Healthy Daughter    Cancer Paternal Uncle        Glioblastoma   Heart disease Maternal Grandmother 50   Diabetes Maternal Grandmother    Heart disease Maternal Grandfather    Cancer Paternal Grandmother        Glioblastoma   Diabetes Paternal Grandfather    Colon cancer Neg Hx    Esophageal cancer Neg Hx    Stomach cancer Neg Hx    Rectal cancer Neg Hx     Social History   Socioeconomic History   Marital status: Married    Spouse name: Not on  file   Number of children: 3   Years of education: Not on file   Highest education level: Not on file  Occupational History   Occupation: Banking    Comment: [employer]  Tobacco Use   Smoking status: Never   Smokeless tobacco: Never  Vaping Use   Vaping status: Never Used  Substance and Sexual Activity   Alcohol use: No    Comment: special occasions   Drug use: No   Sexual activity: Yes    Birth control/protection: I.U.D.  Other Topics Concern   Not on file  Social History Narrative   Not on file   Social Drivers of Health   Financial Resource  Strain: Not on file  Food Insecurity: Not on file  Transportation Needs: Not on file  Physical Activity: Not on file  Stress: Not on file  Social Connections: Not on file  Intimate Partner Violence: Not on file      Review of systems: All other review of systems negative except as mentioned in the HPI.   Physical Exam: Vitals:   05/18/23 1117  BP: 110/70  Pulse: 84   Body mass index is 20.99 kg/m. Gen:      No acute distress HEENT:  sclera anicteric CV: s1s2 rrr, no murmur Lungs: B/l clear. Abd:      soft, non-tender; no palpable masses, no distension Ext:    No edema Neuro: alert and oriented x 3 Psych: normal mood and affect  Data Reviewed:  Reviewed labs, radiology imaging, old records and pertinent past GI work up     Assessment and Plan    Chest Discomfort Chronic, intermittent, non-exertional chest discomfort, predominantly in the back. No clear association with meals. No relief with burping. On Omeprazole  40mg  daily with some improvement. -Continue Omeprazole  40mg  daily until one week before endoscopy. -Perform endoscopy on 06/19/2023 to assess for possible acid reflux damage. -Consider BRAVO test during endoscopy to measure acid reflux if no clear signs of damage are seen during endoscopy.  Abdominal Pain Discomfort in the right upper quadrant and back. No clear association with meals or exertion. -Order complete abdominal ultrasound to assess for possible gallstones, kidney stones, or other abdominal pathology.  Follow-up -Return for endoscopy on 06/19/2023. -Return for results and further management after tests are completed.       This visit required 40 minutes of patient care (this includes precharting, chart review, review of results, face-to-face time used for counseling as well as treatment plan and follow-up. The patient was provided an opportunity to ask questions and all were answered. The patient agreed with the plan and demonstrated an  understanding of the instructions.  LOIS Wilkie Mcgee , MD    CC: Thedora Garnette HERO, MD

## 2023-05-21 ENCOUNTER — Encounter: Payer: Self-pay | Admitting: Gastroenterology

## 2023-05-28 ENCOUNTER — Ambulatory Visit (HOSPITAL_COMMUNITY)
Admission: RE | Admit: 2023-05-28 | Discharge: 2023-05-28 | Disposition: A | Discharge status: Home / Self Care | Payer: BC Managed Care – PPO | Source: Ambulatory Visit | Attending: Gastroenterology | Admitting: Gastroenterology

## 2023-05-28 DIAGNOSIS — R1013 Epigastric pain: Secondary | ICD-10-CM | POA: Insufficient documentation

## 2023-05-28 DIAGNOSIS — R0789 Other chest pain: Secondary | ICD-10-CM | POA: Insufficient documentation

## 2023-05-28 DIAGNOSIS — G8929 Other chronic pain: Secondary | ICD-10-CM | POA: Diagnosis not present

## 2023-05-28 DIAGNOSIS — R1011 Right upper quadrant pain: Secondary | ICD-10-CM | POA: Insufficient documentation

## 2023-05-28 DIAGNOSIS — R109 Unspecified abdominal pain: Secondary | ICD-10-CM | POA: Diagnosis not present

## 2023-05-29 ENCOUNTER — Encounter: Payer: Self-pay | Admitting: Gastroenterology

## 2023-06-18 ENCOUNTER — Telehealth: Payer: Self-pay | Admitting: Gastroenterology

## 2023-06-18 NOTE — Telephone Encounter (Signed)
Returned pt's call, she states she "feels fine", denies fever. States she started with a cold 1 1/2 weeks ago. C/o congestion in the chest with a little bit of a dry cough, but feels like she is getting better each day. Denies ever having a productive cough. Asking if she can still proceed with EGD with bravo tomorrow. Will await MD recommendations and call pt back.

## 2023-06-18 NOTE — Telephone Encounter (Signed)
Given its >10 days, it should be fine to proceed. I am ccing Cathlyn Parsons, to make sure he agrees as well.

## 2023-06-18 NOTE — Telephone Encounter (Signed)
Called pt to update that we will plan to proceed with procedure tomorrow based on MD and CRNA recommendations. Pt did not answer, detailed message left on voicemail (which was discussed during the previous call).

## 2023-06-18 NOTE — Telephone Encounter (Signed)
Inbound call from patient, state she is having some congestion and would like to discuss if she can continue Bravo for tomorrow.

## 2023-06-19 ENCOUNTER — Encounter: Payer: Self-pay | Admitting: Gastroenterology

## 2023-06-19 ENCOUNTER — Ambulatory Visit: Payer: BC Managed Care – PPO | Admitting: Gastroenterology

## 2023-06-19 VITALS — BP 102/66 | HR 73 | Temp 97.7°F | Resp 11 | Ht 67.0 in | Wt 134.0 lb

## 2023-06-19 DIAGNOSIS — R0789 Other chest pain: Secondary | ICD-10-CM

## 2023-06-19 DIAGNOSIS — K2100 Gastro-esophageal reflux disease with esophagitis, without bleeding: Secondary | ICD-10-CM

## 2023-06-19 DIAGNOSIS — K219 Gastro-esophageal reflux disease without esophagitis: Secondary | ICD-10-CM

## 2023-06-19 DIAGNOSIS — K229 Disease of esophagus, unspecified: Secondary | ICD-10-CM | POA: Diagnosis not present

## 2023-06-19 MED ORDER — SODIUM CHLORIDE 0.9 % IV SOLN: 500.0000 mL | Freq: Once | INTRAVENOUS | Status: AC

## 2023-06-19 MED ORDER — FAMOTIDINE 20 MG PO TABS: 20.0000 mg | ORAL_TABLET | ORAL | 1 refills | Status: DC | PRN

## 2023-06-19 MED ORDER — PANTOPRAZOLE SODIUM 40 MG PO TBEC: 40.0000 mg | DELAYED_RELEASE_TABLET | Freq: Every day | ORAL | 3 refills | Status: AC

## 2023-06-19 NOTE — Progress Notes (Signed)
Pt's states no medical or surgical changes since previsit or office visit.

## 2023-06-19 NOTE — Op Note (Signed)
[facility] Patient Name: Abigail Choi Procedure Date: 06/19/2023 10:30 AM MRN: [id number] Endoscopist: Napoleon Form , MD, [id number] Age: 53 Referring MD:  Date of Birth: [birth year] Gender: Female Account #: [id number] Procedure:                Upper GI endoscopy Indications:              Esophageal reflux symptoms that persist despite                            appropriate therapy, Unexplained chest pain, Chest                            pain (non cardiac) Medicines:                Monitored Anesthesia Care Procedure:                Pre-Anesthesia Assessment:                           - Prior to the procedure, a History and Physical                            was performed, and patient medications and                            allergies were reviewed. The patient's tolerance of                            previous anesthesia was also reviewed. The risks                            and benefits of the procedure and the sedation                            options and risks were discussed with the patient.                            All questions were answered, and informed consent                            was obtained. Prior Anticoagulants: The patient has                            taken no anticoagulant or antiplatelet agents. ASA                            Grade Assessment: II - A patient with mild systemic                            disease. After reviewing the risks and benefits,                            the patient was deemed in satisfactory condition to  undergo the procedure.                           After obtaining informed consent, the endoscope was                            passed under direct vision. Throughout the                            procedure, the patient's blood pressure, pulse, and                            oxygen saturations were monitored continuously. The                            GIF [id number] #[id number] was  introduced through the                            mouth, and advanced to the second part of duodenum.                            The upper GI endoscopy was accomplished without                            difficulty. The patient tolerated the procedure                            well. Scope In: Scope Out: Findings:                 LA Grade B (one or more mucosal breaks greater than                            5 mm, not extending between the tops of two mucosal                            folds) esophagitis with no bleeding was found 36 to                            37 cm from the incisors. Biopsies were obtained                            from the proximal and distal esophagus with cold                            forceps for histology of suspected eosinophilic                            esophagitis.                           The gastroesophageal flap valve was visualized                            endoscopically and classified as  Hill Grade II                            (fold present, opens with respiration).                           The stomach was normal.                           The cardia and gastric fundus were normal on                            retroflexion.                           The examined duodenum was normal. Complications:            No immediate complications. Estimated Blood Loss:     Estimated blood loss was minimal. Impression:               - LA Grade B reflux esophagitis with no bleeding.                           - Gastroesophageal flap valve classified as Hill                            Grade II (fold present, opens with respiration).                           - Normal stomach.                           - Normal examined duodenum.                           - Biopsies were taken with a cold forceps for                            evaluation of eosinophilic esophagitis. Recommendation:           - Patient has a contact number available for                             emergencies. The signs and symptoms of potential                            delayed complications were discussed with the                            patient. Return to normal activities tomorrow.                            Written discharge instructions were provided to the                            patient.                           -  Resume previous diet.                           - Continue present medications.                           - Await pathology results.                           - Follow an antireflux regimen.                           - Use Protonix (pantoprazole) 40 mg PO daily.                           - Use Pepcid (famotidine) 20 mg PO daily at bedtime                            as needed. Napoleon Form, MD 06/19/2023 10:49:29 AM This report has been signed electronically.

## 2023-06-19 NOTE — Progress Notes (Signed)
Called to room to assist during endoscopic procedure.  Patient ID and intended procedure confirmed with present staff. Received instructions for my participation in the procedure from the performing physician.

## 2023-06-19 NOTE — Progress Notes (Signed)
Sedate, gd SR, tolerated procedure well, VSS, report to RN

## 2023-06-19 NOTE — Patient Instructions (Signed)

## 2023-06-19 NOTE — Progress Notes (Signed)
[facility] History and Physical   Primary Care Physician:  Loyola Mast, MD   Reason for Procedure:  GERD, atypical chest pain  Plan:    EGD with Bravo/ possible interventions as needed     HPI: Abigail Choi is a very pleasant 53 y.o. female here for EGD with Bravo placement for further evaluation of non cardiac chest pain and atypical GERD symptoms. Please refer to office visit note 05/18/23 for additional details  The risks and benefits as well as alternatives of endoscopic procedure(s) have been discussed and reviewed. All questions answered. The patient agrees to proceed.    Past Medical History:  Diagnosis Date   Frequent headaches    PONV (postoperative nausea and vomiting)    RUQ abdominal pain    Vitamin D deficiency     Past Surgical History:  Procedure Laterality Date   TOE SURGERY Right 11/2020   RIGHT great toe   WISDOM TOOTH EXTRACTION      Prior to Admission medications   Medication Sig Start Date End Date Taking? Authorizing Provider  levonorgestrel (MIRENA) 20 MCG/24HR IUD 1 each by Intrauterine route once.   Yes [provider]  omeprazole (PRILOSEC) 40 MG capsule Take 1 capsule (40 mg total) by mouth daily. 02/11/23  Yes Loyola Mast, MD    Current Outpatient Medications  Medication Sig Dispense Refill   levonorgestrel (MIRENA) 20 MCG/24HR IUD 1 each by Intrauterine route once.     omeprazole (PRILOSEC) 40 MG capsule Take 1 capsule (40 mg total) by mouth daily. 30 capsule 3   Current Facility-Administered Medications  Medication Dose Route Frequency Provider Last Rate Last Admin   0.9 %  sodium chloride infusion  500 mL Intravenous Once Bettie Capistran V, MD       0.9 %  sodium chloride infusion  500 mL Intravenous Once Daeton Kluth, Eleonore Chiquito, MD        Allergies as of 06/19/2023   (No Known Allergies)    Family History  Problem Relation Age of Onset   Colon polyps Mother 73   Hypertension Father    Healthy  Daughter    Healthy Daughter    Healthy Daughter    Cancer Paternal Uncle        Glioblastoma   Heart disease Maternal Grandmother 50   Diabetes Maternal Grandmother    Heart disease Maternal Grandfather    Cancer Paternal Grandmother        Glioblastoma   Diabetes Paternal Grandfather    Colon cancer Neg Hx    Esophageal cancer Neg Hx    Stomach cancer Neg Hx    Rectal cancer Neg Hx     Social History   Socioeconomic History   Marital status: Married    Spouse name: Not on file   Number of children: 3   Years of education: Not on file   Highest education level: Not on file  Occupational History   Occupation: Banking    Comment: [employer]  Tobacco Use   Smoking status: Never   Smokeless tobacco: Never  Vaping Use   Vaping status: Never Used  Substance and Sexual Activity   Alcohol use: No    Comment: special occasions   Drug use: No   Sexual activity: Yes    Birth control/protection: I.U.D.  Other Topics Concern   Not on file  Social History Narrative   Not on file   Social Drivers of Health   Financial Resource Strain: Not on file  Food Insecurity: Not on file  Transportation Needs: Not on file  Physical Activity: Not on file  Stress: Not on file  Social Connections: Not on file  Intimate Partner Violence: Not on file    Review of Systems:  All other review of systems negative except as mentioned in the HPI.  Physical Exam: Vital signs in last 24 hours: BP 117/68   Pulse 87   Temp 97.7 F (36.5 C) (Skin)   Ht 5\' 7"  (1.702 m)   Wt 134 lb (60.8 kg)   SpO2 99%   BMI 20.99 kg/m  General:   Alert, NAD Lungs:  Clear .   Heart:  Regular rate and rhythm Abdomen:  Soft, nontender and nondistended. Neuro/Psych:  Alert and cooperative. Normal mood and affect. A and O x 3  Reviewed labs, radiology imaging, old records and pertinent past GI work up  Patient is appropriate for planned procedure(s) and anesthesia in an ambulatory setting   K. Scherry Ran , MD [phone]

## 2023-06-22 ENCOUNTER — Telehealth: Payer: Self-pay

## 2023-06-22 NOTE — Telephone Encounter (Signed)
 No answer, left message to call if having any issues or concerns, B.Vale Mousseau RN

## 2023-06-23 LAB — SURGICAL PATHOLOGY

## 2023-07-10 ENCOUNTER — Telehealth: Payer: Self-pay

## 2023-07-10 NOTE — Telephone Encounter (Signed)
 Called Pt to follow up on health screenings. Last mammogram was 07/21/2022 and cervical screening 06/15/2020, records were requested by Nestor Ramp OBYGN on 07/10/2023. Pt didn't answer left VM. CPE due 10/06/2023.

## 2023-07-12 ENCOUNTER — Other Ambulatory Visit: Payer: Self-pay | Admitting: Gastroenterology

## 2023-07-12 DIAGNOSIS — R0789 Other chest pain: Secondary | ICD-10-CM

## 2023-07-12 DIAGNOSIS — K219 Gastro-esophageal reflux disease without esophagitis: Secondary | ICD-10-CM

## 2023-07-13 ENCOUNTER — Encounter: Payer: Self-pay | Admitting: Family Medicine

## 2023-08-10 ENCOUNTER — Encounter: Payer: Self-pay | Admitting: Family Medicine

## 2023-08-10 ENCOUNTER — Encounter: Payer: Self-pay | Admitting: Gastroenterology

## 2023-08-10 DIAGNOSIS — Z1231 Encounter for screening mammogram for malignant neoplasm of breast: Secondary | ICD-10-CM | POA: Diagnosis not present

## 2023-08-10 DIAGNOSIS — Z01419 Encounter for gynecological examination (general) (routine) without abnormal findings: Secondary | ICD-10-CM | POA: Diagnosis not present

## 2023-12-08 ENCOUNTER — Encounter: Admitting: Family Medicine

## 2023-12-11 ENCOUNTER — Encounter: Admitting: Family Medicine

## 2023-12-30 ENCOUNTER — Encounter: Payer: Self-pay | Admitting: Family Medicine

## 2023-12-30 ENCOUNTER — Ambulatory Visit: Attending: Family Medicine

## 2023-12-30 ENCOUNTER — Ambulatory Visit (INDEPENDENT_AMBULATORY_CARE_PROVIDER_SITE_OTHER): Admitting: Family Medicine

## 2023-12-30 ENCOUNTER — Ambulatory Visit: Payer: Self-pay | Admitting: Family Medicine

## 2023-12-30 VITALS — BP 106/64 | HR 76 | Temp 97.8°F | Ht 67.0 in | Wt 138.2 lb

## 2023-12-30 DIAGNOSIS — Z862 Personal history of diseases of the blood and blood-forming organs and certain disorders involving the immune mechanism: Secondary | ICD-10-CM | POA: Diagnosis not present

## 2023-12-30 DIAGNOSIS — R002 Palpitations: Secondary | ICD-10-CM | POA: Insufficient documentation

## 2023-12-30 DIAGNOSIS — Z0000 Encounter for general adult medical examination without abnormal findings: Secondary | ICD-10-CM | POA: Diagnosis not present

## 2023-12-30 DIAGNOSIS — E559 Vitamin D deficiency, unspecified: Secondary | ICD-10-CM

## 2023-12-30 LAB — CBC
HCT: 35.5 % — ABNORMAL LOW (ref 36.0–46.0)
Hemoglobin: 12.1 g/dL (ref 12.0–15.0)
MCHC: 34.2 g/dL (ref 30.0–36.0)
MCV: 91.6 fl (ref 78.0–100.0)
Platelets: 176 K/uL (ref 150.0–400.0)
RBC: 3.88 Mil/uL (ref 3.87–5.11)
RDW: 12.3 % (ref 11.5–15.5)
WBC: 4.1 K/uL (ref 4.0–10.5)

## 2023-12-30 LAB — IRON,TIBC AND FERRITIN PANEL
%SAT: 26 % (ref 16–45)
Ferritin: 30 ng/mL (ref 16–232)
Iron: 72 ug/dL (ref 45–160)
TIBC: 279 ug/dL (ref 250–450)

## 2023-12-30 LAB — VITAMIN D 25 HYDROXY (VIT D DEFICIENCY, FRACTURES): VITD: 28.22 ng/mL — ABNORMAL LOW (ref 30.00–100.00)

## 2023-12-30 NOTE — Progress Notes (Unsigned)
 EP to read.

## 2023-12-30 NOTE — Assessment & Plan Note (Signed)
 Past Vitamin D  levels have been mildly low. We will reassess this today.

## 2023-12-30 NOTE — Assessment & Plan Note (Signed)
Abigail Choi has had mild microcytic anemia in the past. I will reassess today and check iron levels.

## 2023-12-30 NOTE — Progress Notes (Signed)
 [facility] [address] [place] [address] Dept: [phone] Dept Fax: [phone]  Annual Physical Visit  Subjective:    Patient ID: Abigail Choi Ok, female    DOB: 16-Nov-1970, 53 y.o..   MRN: [id number]  Chief Complaint  Patient presents with   Annual Exam    CPE/labs.  Not fasting today.     History of Present Illness:  Patient is in today for an annual physical/preventative visit.  Review of Systems  Constitutional:  Negative for chills, diaphoresis, fever, malaise/fatigue and weight loss.  HENT:  Negative for congestion, ear pain, hearing loss, sinus pain, sore throat and tinnitus.   Eyes:  Negative for blurred vision, pain, discharge and redness.  Respiratory:  Negative for cough, shortness of breath and wheezing.   Cardiovascular:  Positive for palpitations. Negative for chest pain.       Continues to have brief episodes of a sense that her heart is skipping beats. she has been unable to identify a specific cause. She does report significant stressors at work. She only drinks limited amounts of caffeine. She notes she did have a Holter monitor study about 8 years ago.  Gastrointestinal:  Positive for heartburn. Negative for abdominal pain, constipation, diarrhea, nausea and vomiting.       Had an EGD int he last year that showed evidence of reflux. Currently taking pantoprazole  as needed.  Musculoskeletal:  Negative for back pain, joint pain and myalgias.  Skin:  Negative for itching and rash.  Psychiatric/Behavioral:  Negative for depression. The patient has insomnia. The patient is not nervous/anxious.        Notes some issues with early awakening with difficulty returning to sleep. Is considering starting on magnesium supplements to see if this helps. 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   Chest pain 11/16/2014   Past Surgical History:  Procedure Laterality Date   TOE SURGERY Right 11/2020   RIGHT great toe   WISDOM TOOTH EXTRACTION     Family History  Problem Relation Age of Onset   Colon polyps Mother 21   Hypertension Father    Healthy Daughter    Healthy Daughter    Healthy Daughter    Cancer Paternal Uncle        Glioblastoma   Heart disease Maternal Grandmother 50   Diabetes Maternal Grandmother    Heart disease Maternal Grandfather    Cancer Paternal Grandmother        Glioblastoma   Diabetes Paternal Grandfather    Colon cancer Neg Hx    Esophageal cancer Neg Hx    Stomach cancer Neg Hx    Rectal cancer Neg Hx    Outpatient Medications Prior to Visit  Medication Sig Dispense Refill   famotidine  (PEPCID ) 20 MG tablet TAKE 1 TABLET (20 MG TOTAL) BY MOUTH AS NEEDED FOR HEARTBURN OR INDIGESTION (AS NEEDED AT BEDTIME). 90 tablet 1   levonorgestrel  (MIRENA ) 20 MCG/24HR IUD 1 each by Intrauterine route once.     pantoprazole  (PROTONIX ) 40 MG tablet Take 1 tablet (40 mg total) by mouth daily.  90 tablet 3   Facility-Administered Medications Prior to Visit  Medication Dose Route Frequency Provider Last Rate Last Admin   0.9 %  sodium chloride  infusion  500 mL Intravenous Once Nandigam, Kavitha V, MD       0.9 %  sodium chloride  infusion  500 mL Intravenous Once Nandigam, Kavitha V, MD       No Known Allergies Objective:   Today's Vitals   12/30/23 1342  BP: 106/64  Pulse: 76  Temp: 97.8 F (36.6 C)  TempSrc: Temporal  SpO2: 98%  Weight: 138 lb 3.2 oz (62.7 kg)  Height: 5' 7 (1.702 m)   Body mass index is 21.65 kg/m.   General: Well developed, well  nourished. No acute distress. HEENT: Normocephalic, non-traumatic. PERRL, EOMI. Conjunctiva clear. External ears normal. EAC and TMs normal   bilaterally. Nose clear without congestion or rhinorrhea. Mucous membranes moist. Oropharynx clear. Good dentition. Neck: Supple. No lymphadenopathy. No thyromegaly. Lungs: Clear to auscultation bilaterally. No wheezing, rales or rhonchi. CV: RRR without murmurs or rubs. Pulses 2+ bilaterally. Abdomen: Soft, non-tender. Bowel sounds positive, normal pitch and frequency. No hepatosplenomegaly. No rebound   or guarding. Extremities: Full ROM. No joint swelling or tenderness. No edema noted. Skin: Warm and dry. No rashes. Psych: Alert and oriented. Normal mood and affect.  Health Maintenance Due  Topic Date Due   HIV Screening  Never done   Hepatitis B Vaccines 19-59 Average Risk (1 of 3 - 19+ 3-dose series) Never done   Zoster Vaccines- Shingrix (1 of 2) Never done   Pneumococcal Vaccine: 50+ Years (1 of 1 - PCV) Never done     Assessment & Plan:   Problem List Items Addressed This Visit       Other   Annual physical exam - Primary   Overall health is excellent. Recommend regular exercise (150 minutes of moderate-intensity exercise each week). Discussed recommended screenings and immunizations.       History of anemia   Ms. Gose has had mild microcytic anemia in the past. I will reassess today and check iron levels.      Relevant Orders   CBC (Completed)   Iron, TIBC and Ferritin Panel   Palpitations   Etiology is unclear. I will order a Zio patch and see if we can better characterize these episodes.      Relevant Orders   LONG TERM MONITOR (3-14 DAYS)   Vitamin D  insufficiency   Past Vitamin D  levels have been mildly low. We will reassess this today.      Relevant Orders   VITAMIN D  25 Hydroxy (Vit-D Deficiency, Fractures) (Completed)    Return in about 1 year (around 12/29/2024) for Reassessment.   Garnette CHRISTELLA Simpler, MD

## 2023-12-30 NOTE — Assessment & Plan Note (Signed)
 Overall health is excellent. Recommend regular exercise (150 minutes of moderate-intensity exercise each week). Discussed recommended screenings and immunizations.

## 2023-12-30 NOTE — Assessment & Plan Note (Signed)
 Etiology is unclear. I will order a Zio patch and see if we can better characterize these episodes.

## 2024-01-05 DIAGNOSIS — D225 Melanocytic nevi of trunk: Secondary | ICD-10-CM | POA: Diagnosis not present

## 2024-01-05 DIAGNOSIS — L603 Nail dystrophy: Secondary | ICD-10-CM | POA: Diagnosis not present

## 2024-01-05 DIAGNOSIS — L821 Other seborrheic keratosis: Secondary | ICD-10-CM | POA: Diagnosis not present

## 2024-01-05 DIAGNOSIS — L578 Other skin changes due to chronic exposure to nonionizing radiation: Secondary | ICD-10-CM | POA: Diagnosis not present

## 2024-02-12 DIAGNOSIS — R002 Palpitations: Secondary | ICD-10-CM | POA: Diagnosis not present

## 2024-02-25 DIAGNOSIS — M545 Low back pain, unspecified: Secondary | ICD-10-CM | POA: Diagnosis not present
